# Patient Record
Sex: Female | Born: 1985 | Race: Black or African American | Hispanic: No | Marital: Single | Smoking: Never smoker
Health system: Southern US, Community
[De-identification: ages and names within clinical notes are randomized; demographics above are authoritative.]

## PROBLEM LIST (undated history)

## (undated) DIAGNOSIS — E559 Vitamin D deficiency, unspecified: Secondary | ICD-10-CM

## (undated) HISTORY — DX: Vitamin D deficiency, unspecified: E55.9

## (undated) HISTORY — PX: KNEE SURGERY: SHX244

---
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
## 2006-11-18 ENCOUNTER — Other Ambulatory Visit: Admission: RE | Admit: 2006-11-18 | Discharge: 2006-11-18 | Payer: Self-pay | Admitting: Obstetrics and Gynecology

## 2008-02-23 ENCOUNTER — Emergency Department (HOSPITAL_COMMUNITY): Admission: EM | Admit: 2008-02-23 | Discharge: 2008-02-23 | Payer: Self-pay | Admitting: Family Medicine

## 2008-02-23 DIAGNOSIS — Z8619 Personal history of other infectious and parasitic diseases: Secondary | ICD-10-CM

## 2008-02-26 ENCOUNTER — Emergency Department (HOSPITAL_COMMUNITY): Admission: EM | Admit: 2008-02-26 | Discharge: 2008-02-26 | Payer: Self-pay | Admitting: Emergency Medicine

## 2008-03-02 ENCOUNTER — Emergency Department (HOSPITAL_COMMUNITY): Admission: EM | Admit: 2008-03-02 | Discharge: 2008-03-02 | Payer: Self-pay | Admitting: Emergency Medicine

## 2008-06-24 ENCOUNTER — Ambulatory Visit: Payer: Self-pay | Admitting: Internal Medicine

## 2008-06-24 DIAGNOSIS — R635 Abnormal weight gain: Secondary | ICD-10-CM

## 2008-06-24 DIAGNOSIS — M542 Cervicalgia: Secondary | ICD-10-CM

## 2008-12-07 ENCOUNTER — Telehealth (INDEPENDENT_AMBULATORY_CARE_PROVIDER_SITE_OTHER): Payer: Self-pay | Admitting: Internal Medicine

## 2008-12-09 ENCOUNTER — Ambulatory Visit: Payer: Self-pay | Admitting: Internal Medicine

## 2008-12-09 DIAGNOSIS — J301 Allergic rhinitis due to pollen: Secondary | ICD-10-CM

## 2009-10-12 ENCOUNTER — Emergency Department (HOSPITAL_COMMUNITY): Admission: EM | Admit: 2009-10-12 | Discharge: 2009-10-12 | Payer: Self-pay | Admitting: Emergency Medicine

## 2009-10-13 ENCOUNTER — Telehealth (INDEPENDENT_AMBULATORY_CARE_PROVIDER_SITE_OTHER): Payer: Self-pay | Admitting: Internal Medicine

## 2009-10-13 DIAGNOSIS — K089 Disorder of teeth and supporting structures, unspecified: Secondary | ICD-10-CM | POA: Insufficient documentation

## 2010-05-08 NOTE — Progress Notes (Signed)
Summary: pt is under a tooth pain  Phone Note Call from Patient Call back at [phone]   Summary of Call: Since this past Tuesday, the pt is under a tooth pain (probably infected) and she needs a referral to see a dentist.  Pt has the orange card. Jaidalyn Schillo MD Initial call taken by: Manon Hilding,  October 13, 2009 8:32 AM  Follow-up for Phone Call        Left message on answering machine to return call. Dutch Quint RN  October 13, 2009 9:59 AM  Additional Follow-up for Phone Call Additional follow up Details #1::        Seen in ED yesterday and started on Amoxicillin and Vicodin for dental pain--refer to dental clinic please, [facility].  Theresa--please let pt. know we are referring Additional Follow-up by: Julieanne Manson MD,  October 13, 2009 5:19 PM  New Problems: DENTAL PAIN (ICD-525.9)   Additional Follow-up for Phone Call Additional follow up Details #2::    Left message on answering machine to return call.  Dutch Quint RN  October 16, 2009 2:38 PM   New Problems: DENTAL PAIN (ICD-525.9)  Appended Document: pt is under a tooth pain Left message on work # -- memory full on home phone -- Dutch Quint RN  October 17, 2009 10:14 AM  Micah Flesher to [facility] in [place] and had fluid drained out of tooth area, feeling better.  Has impacted tooth, advised of dental referral in progress.  Referred to [facility] for questions related to assistance with payment of out-of-pocket costs.  Dutch Quint RN  October 17, 2009 1:03 PM

## 2010-05-14 ENCOUNTER — Encounter (INDEPENDENT_AMBULATORY_CARE_PROVIDER_SITE_OTHER): Payer: Self-pay | Admitting: Internal Medicine

## 2010-05-16 ENCOUNTER — Encounter (INDEPENDENT_AMBULATORY_CARE_PROVIDER_SITE_OTHER): Payer: Self-pay | Admitting: Internal Medicine

## 2010-05-24 NOTE — Assessment & Plan Note (Signed)
Summary: TB test and Flu vaccine  Nurse Visit Pt. in for flu vaccine and TB test both needed for school, [employer]. Advised pt has not been seen here in a while recommend she schedule appt. when able. Gaylyn Cheers RN  May 14, 2010 11:18 AM   Allergies: No Known Drug Allergies  Immunizations Administered:  Influenza Vaccine # 1:    Vaccine Type: Fluvax 3+    Site: left deltoid    Mfr: Sanofi Pasteur    Dose: 0.5 ml    Route: IM    Given by: Gaylyn Cheers RN    Exp. Date: 10/06/2010    Lot #: [id number]    VIS given: 10/31/09 version given May 14, 2010.  PPD Skin Test:    Vaccine Type: PPD    Site: left forearm    Mfr: JHP Phar    Dose: 0.1 ml    Route: ID    Given by: Gaylyn Cheers RN    Exp. Date: 08/2011    Lot #: [id number]  Flu Vaccine Consent Questions:    Do you have a history of severe allergic reactions to this vaccine? no    Any prior history of allergic reactions to egg and/or gelatin? no    Do you have a sensitivity to the preservative Thimersol? no    Do you have a past history of Guillan-Barre Syndrome? no    Do you currently have an acute febrile illness? no    Have you ever had a severe reaction to latex? no    Vaccine information given and explained to patient? yes    Are you currently pregnant? no  Orders Added: 1)  Flu Vaccine 67yrs + [90658] 2)  Admin 1st Vaccine [90471] 3)  TB Skin Test [86580] 4)  Admin of Any Addtl Vaccine [90472] 5)  Est. Patient Nurse visit [09003]

## 2010-05-24 NOTE — Assessment & Plan Note (Signed)
Summary: PPD reading  Nurse Visit   Allergies: No Known Drug Allergies  PPD Results    Date of reading: 05/16/2010    Results: < 5mm    Interpretation: negative  Orders Added: 1)  Est. Patient Level I [95621]

## 2010-10-30 ENCOUNTER — Inpatient Hospital Stay (INDEPENDENT_AMBULATORY_CARE_PROVIDER_SITE_OTHER)
Admission: RE | Admit: 2010-10-30 | Discharge: 2010-10-30 | Disposition: A | Discharge status: Home / Self Care | Payer: 59 | Source: Ambulatory Visit | Attending: Family Medicine | Admitting: Family Medicine

## 2010-10-30 DIAGNOSIS — J309 Allergic rhinitis, unspecified: Secondary | ICD-10-CM

## 2011-01-08 LAB — POCT RAPID STREP A: Streptococcus, Group A Screen (Direct): NEGATIVE

## 2011-01-08 LAB — POCT INFECTIOUS MONO SCREEN: Mono Screen: POSITIVE — AB

## 2013-02-05 ENCOUNTER — Emergency Department (INDEPENDENT_AMBULATORY_CARE_PROVIDER_SITE_OTHER)
Admission: EM | Admit: 2013-02-05 | Discharge: 2013-02-05 | Disposition: A | Discharge status: Home / Self Care | Payer: Self-pay | Source: Home / Self Care

## 2013-02-05 ENCOUNTER — Encounter (HOSPITAL_COMMUNITY): Payer: Self-pay | Admitting: Emergency Medicine

## 2013-02-05 DIAGNOSIS — R5381 Other malaise: Secondary | ICD-10-CM

## 2013-02-05 DIAGNOSIS — G933 Postviral fatigue syndrome: Secondary | ICD-10-CM

## 2013-02-05 MED ORDER — MAGIC MOUTHWASH W/LIDOCAINE: 10.0000 mL | Freq: Four times a day (QID) | ORAL | Status: DC | PRN

## 2013-02-05 NOTE — ED Provider Notes (Signed)
CSN: [id number]     Arrival date & time 02/05/13  1612 History   None    Chief Complaint  Patient presents with  . Sore Throat   (Consider location/radiation/quality/duration/timing/severity/associated sxs/prior Treatment) Patient is a 27 y.o. female presenting with pharyngitis. The history is provided by the patient.  Sore Throat This is a new problem. The current episode started yesterday. The problem has not changed since onset.   History reviewed. No pertinent past medical history. History reviewed. No pertinent past surgical history. History reviewed. No pertinent family history. History  Substance Use Topics  . Smoking status: Never Smoker   . Smokeless tobacco: Not on file  . Alcohol Use: Not on file   OB History   Grav Para Term Preterm Abortions TAB SAB Ect Mult Living                 Review of Systems  Constitutional: Negative.   HENT: Positive for sore throat. Negative for postnasal drip and rhinorrhea.   Cardiovascular: Negative.   Gastrointestinal: Negative.   Hematological: Positive for adenopathy.    Allergies  Shellfish allergy  Home Medications   Current Outpatient Rx  Name  Route  Sig  Dispense  Refill  . Alum & Mag Hydroxide-Simeth (MAGIC MOUTHWASH W/LIDOCAINE) SOLN   Oral   Take 10 mLs by mouth 4 (four) times daily as needed. Gargle and spit   200 mL   1    BP 133/72  Pulse 78  Temp(Src) 98 F (36.7 C) (Oral)  Resp 16  SpO2 100%  LMP 01/06/2013 Physical Exam  Nursing note and vitals reviewed. Constitutional: She is oriented to person, place, and time. She appears well-developed and well-nourished.  HENT:  Head: Normocephalic.  Right Ear: External ear normal.  Left Ear: External ear normal.  Nose: Nose normal.  Mouth/Throat: Uvula is midline and mucous membranes are normal. Oropharyngeal exudate and posterior oropharyngeal erythema present.  Abdominal: Soft. Bowel sounds are normal.  Lymphadenopathy:    She has cervical  adenopathy.  Neurological: She is alert and oriented to person, place, and time.  Skin: Skin is warm and dry.    ED Course  Procedures (including critical care time) Labs Review Labs Reviewed  CULTURE, GROUP A STREP  POCT RAPID STREP A ([facility] ONLY)   Imaging Review No results found.    MDM  Rapid srep neg.    Linna Hoff, MD 02/05/13 [phone]

## 2013-02-05 NOTE — ED Notes (Signed)
C/o sore throat which started yesterday.  Patient feels as if it is mono since she has had that before and has the same sx.

## 2013-02-07 LAB — CULTURE, GROUP A STREP

## 2014-06-28 ENCOUNTER — Emergency Department (HOSPITAL_COMMUNITY): Payer: No Typology Code available for payment source

## 2014-06-28 ENCOUNTER — Emergency Department (INDEPENDENT_AMBULATORY_CARE_PROVIDER_SITE_OTHER)
Admission: EM | Admit: 2014-06-28 | Discharge: 2014-06-28 | Disposition: A | Discharge status: Nursing Home (Includes ICF) | Payer: PRIVATE HEALTH INSURANCE | Source: Home / Self Care | Attending: Family Medicine | Admitting: Family Medicine

## 2014-06-28 ENCOUNTER — Encounter (HOSPITAL_COMMUNITY): Payer: Self-pay | Admitting: Emergency Medicine

## 2014-06-28 ENCOUNTER — Emergency Department (HOSPITAL_COMMUNITY)
Admission: EM | Admit: 2014-06-28 | Discharge: 2014-06-28 | Disposition: A | Discharge status: Home / Self Care | Payer: No Typology Code available for payment source | Attending: Emergency Medicine | Admitting: Emergency Medicine

## 2014-06-28 ENCOUNTER — Encounter (HOSPITAL_COMMUNITY): Payer: Self-pay | Admitting: *Deleted

## 2014-06-28 DIAGNOSIS — R5081 Fever presenting with conditions classified elsewhere: Secondary | ICD-10-CM | POA: Diagnosis not present

## 2014-06-28 DIAGNOSIS — R630 Anorexia: Secondary | ICD-10-CM | POA: Insufficient documentation

## 2014-06-28 DIAGNOSIS — R6510 Systemic inflammatory response syndrome (SIRS) of non-infectious origin without acute organ dysfunction: Secondary | ICD-10-CM

## 2014-06-28 DIAGNOSIS — Z3202 Encounter for pregnancy test, result negative: Secondary | ICD-10-CM | POA: Diagnosis not present

## 2014-06-28 DIAGNOSIS — J069 Acute upper respiratory infection, unspecified: Secondary | ICD-10-CM

## 2014-06-28 DIAGNOSIS — R509 Fever, unspecified: Secondary | ICD-10-CM

## 2014-06-28 DIAGNOSIS — A419 Sepsis, unspecified organism: Secondary | ICD-10-CM | POA: Diagnosis not present

## 2014-06-28 DIAGNOSIS — M2550 Pain in unspecified joint: Secondary | ICD-10-CM | POA: Insufficient documentation

## 2014-06-28 LAB — CBC WITH DIFFERENTIAL/PLATELET
BASOS ABS: 0 10*3/uL (ref 0.0–0.1)
BASOS PCT: 0 % (ref 0–1)
EOS PCT: 0 % (ref 0–5)
Eosinophils Absolute: 0 10*3/uL (ref 0.0–0.7)
HCT: 38.4 % (ref 36.0–46.0)
HEMOGLOBIN: 12.2 g/dL (ref 12.0–15.0)
LYMPHS ABS: 0.6 10*3/uL — AB (ref 0.7–4.0)
Lymphocytes Relative: 8 % — ABNORMAL LOW (ref 12–46)
MCH: 25.5 pg — AB (ref 26.0–34.0)
MCHC: 31.8 g/dL (ref 30.0–36.0)
MCV: 80.3 fL (ref 78.0–100.0)
Monocytes Absolute: 0.8 10*3/uL (ref 0.1–1.0)
Monocytes Relative: 11 % (ref 3–12)
Neutro Abs: 6.2 10*3/uL (ref 1.7–7.7)
Neutrophils Relative %: 81 % — ABNORMAL HIGH (ref 43–77)
PLATELETS: 173 10*3/uL (ref 150–400)
RBC: 4.78 MIL/uL (ref 3.87–5.11)
RDW: 14.1 % (ref 11.5–15.5)
WBC: 7.6 10*3/uL (ref 4.0–10.5)

## 2014-06-28 LAB — I-STAT BETA HCG BLOOD, ED (MC, WL, AP ONLY): I-stat hCG, quantitative: <5 m[IU]/mL (ref <5)

## 2014-06-28 LAB — BASIC METABOLIC PANEL
Anion gap: 10 (ref 5–15)
BUN: 6 mg/dL (ref 6–23)
CALCIUM: 8.7 mg/dL (ref 8.4–10.5)
CHLORIDE: 100 mmol/L (ref 96–112)
CO2: 22 mmol/L (ref 19–32)
CREATININE: 0.99 mg/dL (ref 0.50–1.10)
GFR calc Af Amer: 90 mL/min — ABNORMAL LOW (ref >90)
GFR calc non Af Amer: 77 mL/min — ABNORMAL LOW (ref >90)
Glucose, Bld: 102 mg/dL — ABNORMAL HIGH (ref 70–99)
POTASSIUM: 3.5 mmol/L (ref 3.5–5.1)
Sodium: 132 mmol/L — ABNORMAL LOW (ref 135–145)

## 2014-06-28 LAB — I-STAT CG4 LACTIC ACID, ED: LACTIC ACID, VENOUS: 0.99 mmol/L (ref 0.5–2.0)

## 2014-06-28 LAB — POCT RAPID STREP A: Streptococcus, Group A Screen (Direct): NEGATIVE

## 2014-06-28 MED ORDER — SODIUM CHLORIDE 0.9 % IV BOLUS (SEPSIS)
1000.0000 mL | Freq: Once | INTRAVENOUS | Status: AC
  Administered 2014-06-28: 1000 mL via INTRAVENOUS

## 2014-06-28 MED ORDER — ACETAMINOPHEN 325 MG PO TABS
650.0000 mg | ORAL_TABLET | Freq: Once | ORAL | Status: AC
  Administered 2014-06-28: 650 mg via ORAL
  Filled 2014-06-28: qty 2

## 2014-06-28 MED ORDER — IBUPROFEN 800 MG PO TABS
800.0000 mg | ORAL_TABLET | Freq: Once | ORAL | Status: AC
  Administered 2014-06-28: 800 mg via ORAL

## 2014-06-28 MED ORDER — IBUPROFEN 800 MG PO TABS
ORAL_TABLET | ORAL | Status: AC
  Filled 2014-06-28: qty 1

## 2014-06-28 NOTE — ED Notes (Signed)
Patient c/o flu like sx including bodyaches, chills, fever, feeling light headed and dizzy and cold sx x 4 days. Patient reports she has tried to take OTC medications with no relief. Patient is in NAD.

## 2014-06-28 NOTE — ED Provider Notes (Signed)
CSN: [id number]     Arrival date & time 06/28/14  1451 History   First MD Initiated Contact with Patient 06/28/14 1607     Chief Complaint  Patient presents with  . Influenza   (Consider location/radiation/quality/duration/timing/severity/associated sxs/prior Treatment) HPI       29 year old female presents complaining of being sick for 4 days. This started with cold symptoms of congestion, rhinorrhea, and a cough. She has gradually gotten sicker up until today. Last night she developed a fever for the first time as well as body aches. Today she is getting very dizzy as well. Her cough has gotten somewhat better. She has nausea without vomiting. She says she has a severe headache also, denies neck stiffness. No rash. No Recent travel or sick contacts.  History reviewed. No pertinent past medical history. History reviewed. No pertinent past surgical history. No family history on file. History  Substance Use Topics  . Smoking status: Never Smoker   . Smokeless tobacco: Not on file  . Alcohol Use: Not on file   OB History    No data available     Review of Systems  Constitutional: Positive for fever and chills.  HENT: Positive for congestion, rhinorrhea and sore throat.   Respiratory: Positive for cough and shortness of breath.   Cardiovascular: Negative for chest pain.  Gastrointestinal: Positive for nausea. Negative for abdominal pain.  Musculoskeletal: Positive for myalgias and arthralgias.  Neurological: Positive for headaches.  All other systems reviewed and are negative.   Allergies  Shellfish allergy  Home Medications   Prior to Admission medications   Medication Sig Start Date End Date Taking? Authorizing Provider  Alum & Mag Hydroxide-Simeth (MAGIC MOUTHWASH W/LIDOCAINE) SOLN Take 10 mLs by mouth 4 (four) times daily as needed. Gargle and spit 02/05/13   Linna HoffJames D Kindl, MD   BP 115/73 mmHg  Pulse 125  Temp(Src) 103.1 F (39.5 C) (Oral)  Resp 16  SpO2 100%  LMP  06/22/2014 Physical Exam  Constitutional: She is oriented to person, place, and time. Vital signs are normal. She appears well-developed and well-nourished. No distress.  HENT:  Head: Normocephalic and atraumatic.  Right Ear: External ear normal.  Left Ear: External ear normal.  Nose: Nose normal.  Mouth/Throat: Posterior oropharyngeal erythema present. No oropharyngeal exudate.  Mild symmetric tonsillar enlargement and erythema  Eyes: Conjunctivae are normal.  Neck: Normal range of motion and full passive range of motion without pain. Neck supple. No Brudzinski's sign and no Kernig's sign noted.  Cardiovascular: Regular rhythm and normal heart sounds.  Tachycardia present.   Pulmonary/Chest: Effort normal and breath sounds normal. No respiratory distress.  Lymphadenopathy:    She has no cervical adenopathy.  Neurological: She is alert and oriented to person, place, and time. She has normal strength. Coordination normal.  Skin: Skin is warm and dry. No rash noted. She is not diaphoretic.  Psychiatric: She has a normal mood and affect. Judgment normal.  Nursing note and vitals reviewed.   ED Course  Procedures (including critical care time) Labs Review Labs Reviewed  POCT RAPID STREP A ([facility] ONLY)    Imaging Review No results found.   MDM   1. SIRS (systemic inflammatory response syndrome)    She is febrile and tachycardic, and dizzy. Her blood pressure is normal. With gradual onset of symptoms, not convincing for a flu like illness. Concern for SIRS/sepsis. IV started, transferred to the emergency department via [facility]    Graylon GoodZachary H Emmalyn Hinson, PA-C 06/28/14  1722 

## 2014-06-28 NOTE — ED Notes (Signed)
Pt was given 800 mg ibuprofen at 1655

## 2014-06-28 NOTE — ED Provider Notes (Signed)
CSN: [id number]     Arrival date & time 06/28/14  1755 History   First MD Initiated Contact with Patient 06/28/14 1801     Chief Complaint  Patient presents with  . Fever  . Cough     (Consider location/radiation/quality/duration/timing/severity/associated sxs/prior Treatment) HPI Comments: Plan 29-year-old female with seasonal allergies, no significant medical history, no recent travel, no infectious disease history presents with cough fever chills body aches since Saturday. No sick contacts. Patient tolerating oral well. Patient sent over from urgent care for possible sepsis workup. Patient said congestion as well and cough. Symptoms intermittent.  Patient is a 29 y.o. female presenting with fever and cough. The history is provided by the patient.  Fever Associated symptoms: congestion and cough   Associated symptoms: no chest pain, no chills, no dysuria, no headaches, no rash and no vomiting   Cough Associated symptoms: fever   Associated symptoms: no chest pain, no chills, no headaches, no rash and no shortness of breath     History reviewed. No pertinent past medical history. History reviewed. No pertinent past surgical history. History reviewed. No pertinent family history. History  Substance Use Topics  . Smoking status: Never Smoker   . Smokeless tobacco: Not on file  . Alcohol Use: Not on file   OB History    No data available     Review of Systems  Constitutional: Positive for fever and appetite change. Negative for chills.  HENT: Positive for congestion.   Eyes: Negative for visual disturbance.  Respiratory: Positive for cough. Negative for shortness of breath.   Cardiovascular: Negative for chest pain.  Gastrointestinal: Negative for vomiting and abdominal pain.  Genitourinary: Negative for dysuria and flank pain.  Musculoskeletal: Positive for arthralgias. Negative for back pain, neck pain and neck stiffness.  Skin: Negative for rash.  Neurological: Negative  for light-headedness and headaches.      Allergies  Shellfish allergy  Home Medications   Prior to Admission medications   Not on File   BP 120/75 mmHg  Pulse 92  Temp(Src) 100.6 F (38.1 C) (Oral)  Resp 16  SpO2 100%  LMP 06/22/2014 Physical Exam  Constitutional: She is oriented to person, place, and time. She appears well-developed and well-nourished.  HENT:  Head: Normocephalic and atraumatic.  Dry mucous membranes no meningismus well-appearing  Eyes: Conjunctivae are normal. Right eye exhibits no discharge. Left eye exhibits no discharge.  Neck: Normal range of motion. Neck supple. No tracheal deviation present.  Cardiovascular: Normal rate and regular rhythm.   Pulmonary/Chest: Effort normal and breath sounds normal.  Abdominal: Soft. She exhibits no distension. There is no tenderness. There is no guarding.  Musculoskeletal: She exhibits no edema or tenderness.  Neurological: She is alert and oriented to person, place, and time.  Skin: Skin is warm. No rash noted.  Psychiatric: She has a normal mood and affect.  Nursing note and vitals reviewed.   ED Course  Procedures (including critical care time) Labs Review Labs Reviewed  CBC WITH DIFFERENTIAL/PLATELET - Abnormal; Notable for the following:    MCH 25.5 (*)    Neutrophils Relative % 81 (*)    Lymphocytes Relative 8 (*)    Lymphs Abs 0.6 (*)    All other components within normal limits  BASIC METABOLIC PANEL  I-STAT CG4 LACTIC ACID, ED  I-STAT BETA HCG BLOOD, ED ([facility], [facility], [facility] ONLY)    Imaging Review Dg Chest 2 View  06/28/2014   CLINICAL DATA:  Acute onset of cough,  congestion and shortness of breath. Initial encounter.  EXAM: CHEST  2 VIEW  COMPARISON:  None.  FINDINGS: The lungs are well-aerated and clear. There is no evidence of focal opacification, pleural effusion or pneumothorax.  The heart is normal in size; the mediastinal contour is within normal limits. No acute osseous abnormalities are seen.   IMPRESSION: No acute cardiopulmonary process seen.   Electronically Signed   By: Roanna Raider M.D.   On: 06/28/2014 19:21     EKG Interpretation None      MDM   Final diagnoses:  URI (upper respiratory infection)  Other specified fever   Well-appearing healthy female with viral-like symptoms, patient sent over sepsis workup, lactate and basic blood work ordered, chest x-ray ordered to look for focal pneumonia. IV fluids and Tylenol given. Discussed reasons to return close follow up outpatient. Patient has no red flags on history of present illness.  Patient clinically does not appear septic, well-appearing in ER. Lactate normal chest x-ray reviewed no acute infiltrate. Results and differential diagnosis were discussed with the patient/parent/guardian. Close follow up outpatient was discussed, comfortable with the plan.   Medications  sodium chloride 0.9 % bolus 1,000 mL (1,000 mLs Intravenous New Bag/Given 06/28/14 1922)  acetaminophen (TYLENOL) tablet 650 mg (650 mg Oral Given 06/28/14 1922)    Filed Vitals:   06/28/14 1759  BP: 120/75  Pulse: 92  Temp: 100.6 F (38.1 C)  TempSrc: Oral  Resp: 16  SpO2: 100%    Final diagnoses:  URI (upper respiratory infection)  Other specified fever       Blane Ohara, MD 06/28/14 1930

## 2014-06-28 NOTE — ED Notes (Signed)
Per EMS: pt coming from UC with c/o fever, chills, body aches for 4 days. [facility]

## 2014-06-28 NOTE — Discharge Instructions (Signed)
If you were given medicines take as directed.  If you are on coumadin or contraceptives realize their levels and effectiveness is altered by many different medicines.  If you have any reaction (rash, tongues swelling, other) to the medicines stop taking and see a physician.   Please follow up as directed and return to the ER or see a physician for new or worsening symptoms.  Thank you. Filed Vitals:   06/28/14 1759  BP: 120/75  Pulse: 92  Temp: 100.6 F (38.1 C)  TempSrc: Oral  Resp: 16  SpO2: 100%

## 2014-07-02 LAB — CULTURE, GROUP A STREP: Strep A Culture: NEGATIVE

## 2014-10-21 ENCOUNTER — Emergency Department (HOSPITAL_COMMUNITY)
Admission: EM | Admit: 2014-10-21 | Discharge: 2014-10-21 | Disposition: A | Discharge status: Home / Self Care | Payer: No Typology Code available for payment source | Attending: Emergency Medicine | Admitting: Emergency Medicine

## 2014-10-21 ENCOUNTER — Encounter (HOSPITAL_COMMUNITY): Payer: Self-pay | Admitting: Emergency Medicine

## 2014-10-21 DIAGNOSIS — S80861A Insect bite (nonvenomous), right lower leg, initial encounter: Secondary | ICD-10-CM | POA: Diagnosis present

## 2014-10-21 DIAGNOSIS — L259 Unspecified contact dermatitis, unspecified cause: Secondary | ICD-10-CM | POA: Diagnosis not present

## 2014-10-21 DIAGNOSIS — Y92007 Garden or yard of unspecified non-institutional (private) residence as the place of occurrence of the external cause: Secondary | ICD-10-CM | POA: Diagnosis not present

## 2014-10-21 DIAGNOSIS — Y998 Other external cause status: Secondary | ICD-10-CM | POA: Insufficient documentation

## 2014-10-21 DIAGNOSIS — Y9389 Activity, other specified: Secondary | ICD-10-CM | POA: Insufficient documentation

## 2014-10-21 DIAGNOSIS — W57XXXA Bitten or stung by nonvenomous insect and other nonvenomous arthropods, initial encounter: Secondary | ICD-10-CM | POA: Diagnosis not present

## 2014-10-21 MED ORDER — CEPHALEXIN 500 MG PO CAPS
500.0000 mg | ORAL_CAPSULE | Freq: Four times a day (QID) | ORAL | Status: DC
Start: 2014-10-21 — End: 2015-01-25

## 2014-10-21 MED ORDER — PREDNISONE 10 MG PO TABS: 40.0000 mg | ORAL_TABLET | Freq: Every day | ORAL | Status: DC

## 2014-10-21 NOTE — ED Provider Notes (Signed)
CSN: [id number]     Arrival date & time 10/21/14  0003 History   First MD Initiated Contact with Patient 10/21/14 0021     Chief Complaint  Patient presents with  . Insect Bite     (Consider location/radiation/quality/duration/timing/severity/associated sxs/prior Treatment) The history is provided by the patient and medical records. No language interpreter was used.    Dorethy L Earlene PlaterDavis is a 29 y.o. female  with no major medical problems presents to the Emergency Department complaining of gradual, persistent, progressively worsening rash to the right posterior leg onset 2 days ago. Associated symptoms include itching, redness and "puss."  Pt has used hydrocortisone cream without total relief.  Nothing makes it better and nothing makes it worse.  Pt denies fever, chills, nausea, vomiting.     History reviewed. No pertinent past medical history. History reviewed. No pertinent past surgical history. No family history on file. History  Substance Use Topics  . Smoking status: Never Smoker   . Smokeless tobacco: Not on file  . Alcohol Use: Yes   OB History    No data available     Review of Systems  Constitutional: Negative for fever, diaphoresis, appetite change, fatigue and unexpected weight change.  HENT: Negative for mouth sores.   Eyes: Negative for visual disturbance.  Respiratory: Negative for cough, chest tightness, shortness of breath and wheezing.   Cardiovascular: Negative for chest pain.  Gastrointestinal: Negative for nausea, vomiting, abdominal pain, diarrhea and constipation.  Endocrine: Negative for polydipsia, polyphagia and polyuria.  Genitourinary: Negative for dysuria, urgency, frequency and hematuria.  Musculoskeletal: Negative for back pain and neck stiffness.  Skin: Positive for rash.  Allergic/Immunologic: Negative for immunocompromised state.  Neurological: Negative for syncope, light-headedness and headaches.  Hematological: Does not bruise/bleed easily.   Psychiatric/Behavioral: Negative for sleep disturbance. The patient is not nervous/anxious.       Allergies  Shellfish allergy  Home Medications   Prior to Admission medications   Medication Sig Start Date End Date Taking? Authorizing Provider  cephALEXin (KEFLEX) 500 MG capsule Take 1 capsule (500 mg total) by mouth 4 (four) times daily. 10/21/14   Marbella Markgraf, PA-C  predniSONE (DELTASONE) 10 MG tablet Take 4 tablets (40 mg total) by mouth daily. 10/21/14   Lazariah Savard, PA-C   BP 136/79 mmHg  Pulse 69  Temp(Src) 98.2 F (36.8 C) (Oral)  Resp 20  SpO2 100%  LMP 10/13/2014 Physical Exam  Constitutional: She is oriented to person, place, and time. She appears well-developed and well-nourished. No distress.  HENT:  Head: Normocephalic and atraumatic.  Right Ear: Tympanic membrane, external ear and ear canal normal.  Left Ear: Tympanic membrane, external ear and ear canal normal.  Nose: Nose normal. No mucosal edema or rhinorrhea.  Mouth/Throat: Uvula is midline. No uvula swelling. No oropharyngeal exudate, posterior oropharyngeal edema, posterior oropharyngeal erythema or tonsillar abscesses.  No swelling of the uvula or oropharynx   Eyes: Conjunctivae are normal.  Neck: Normal range of motion.  Patent airway No stridor; normal phonation Handling secretions without difficulty  Cardiovascular: Normal rate, normal heart sounds and intact distal pulses.   No murmur heard. Pulmonary/Chest: Effort normal and breath sounds normal. No stridor. No respiratory distress. She has no wheezes.  No wheezes or rhonchi  Abdominal: Soft. Bowel sounds are normal. There is no tenderness.  Musculoskeletal: Normal range of motion. She exhibits no edema.  Neurological: She is alert and oriented to person, place, and time.  Skin: Skin is warm and dry. Rash  noted. She is not diaphoretic.  Multiple lesions to the posterior right calf and leg with erythema, mild induration and  vesicles Mild excoriations - no induration or fluctuance to indicate secondary infection No purulent drainage.    Psychiatric: She has a normal mood and affect.  Nursing note and vitals reviewed.   ED Course  Procedures (including critical care time) Labs Review Labs Reviewed - No data to display  Imaging Review No results found.   EKG Interpretation None      MDM   Final diagnoses:  Contact dermatitis   Maelys L Tuckett presents with rash to the right leg.  Rash consistent with poison ivy, poison oak. Patient denies any difficulty breathing or swallowing.  Pt has a patent airway without stridor and is handling secretions without difficulty; no angioedema. Several reactive vesicles noted to the sites.  No blisters, no pustules, no warmth, no draining sinus tracts, no superficial abscesses, no bullous impetigo, no desquamation, no target lesions with dusky purpura or a central bulla. Not tender to touch. No concern for superimposed infection. No concern for SJS, TEN, TSS, tick borne illness, syphilis or other life-threatening condition. Will discharge home with short course of steroids, hydrocortisone; recommend Benadryl as needed for pruritis.    Dahlia Client Jakell Trusty, PA-C 10/21/14 1610  Gwyneth Sprout, MD 10/21/14 2221

## 2014-10-21 NOTE — ED Notes (Signed)
Pt states she was working in her yard 10/20/14 and now c/o severe itching to right calf.  Multiple welts noted with exudate covered with hydrocortrisone cream by pt. States pain 3/10

## 2014-10-21 NOTE — ED Notes (Signed)
Pt. presents with multiple itchy insect bites at right calf onset last night with redness / swelling .

## 2014-10-21 NOTE — Discharge Instructions (Signed)
1. Medications: Prednisone, Benadryl for itching, OTC Zanfel and hydrocortisone lotion, usual home medications 2. Treatment: rest, drink plenty of fluids, take medications as prescribed 3. Follow Up: Please followup with your primary doctor in 3 days for discussion of your diagnoses and further evaluation after today's visit; if you do not have a primary care doctor use the resource guide provided to find one; followup with dermatology as needed; Return to the ER for difficulty breathing, return of allergic reaction or other concerning symptoms   Contact Dermatitis Contact dermatitis is a reaction to certain substances that touch the skin. Contact dermatitis can be either irritant contact dermatitis or allergic contact dermatitis. Irritant contact dermatitis does not require previous exposure to the substance for a reaction to occur.Allergic contact dermatitis only occurs if you have been exposed to the substance before. Upon a repeat exposure, your body reacts to the substance.  CAUSES  Many substances can cause contact dermatitis. Irritant dermatitis is most commonly caused by repeated exposure to mildly irritating substances, such as:  Makeup.  Soaps.  Detergents.  Bleaches.  Acids.  Metal salts, such as nickel. Allergic contact dermatitis is most commonly caused by exposure to:  Poisonous plants.  Chemicals (deodorants, shampoos).  Jewelry.  Latex.  Neomycin in triple antibiotic cream.  Preservatives in products, including clothing. SYMPTOMS  The area of skin that is exposed may develop:  Dryness or flaking.  Redness.  Cracks.  Itching.  Pain or a burning sensation.  Blisters. With allergic contact dermatitis, there may also be swelling in areas such as the eyelids, mouth, or genitals.  DIAGNOSIS  Your caregiver can usually tell what the problem is by doing a physical exam. In cases where the cause is uncertain and an allergic contact dermatitis is suspected, a  patch skin test may be performed to help determine the cause of your dermatitis. TREATMENT Treatment includes protecting the skin from further contact with the irritating substance by avoiding that substance if possible. Barrier creams, powders, and gloves may be helpful. Your caregiver may also recommend:  Steroid creams or ointments applied 2 times daily. For best results, soak the rash area in cool water for 20 minutes. Then apply the medicine. Cover the area with a plastic wrap. You can store the steroid cream in the refrigerator for a "chilly" effect on your rash. That may decrease itching. Oral steroid medicines may be needed in more severe cases.  Antibiotics or antibacterial ointments if a skin infection is present.  Antihistamine lotion or an antihistamine taken by mouth to ease itching.  Lubricants to keep moisture in your skin.  Burow's solution to reduce redness and soreness or to dry a weeping rash. Mix one packet or tablet of solution in 2 cups cool water. Dip a clean washcloth in the mixture, wring it out a bit, and put it on the affected area. Leave the cloth in place for 30 minutes. Do this as often as possible throughout the day.  Taking several cornstarch or baking soda baths daily if the area is too large to cover with a washcloth. Harsh chemicals, such as alkalis or acids, can cause skin damage that is like a burn. You should flush your skin for 15 to 20 minutes with cold water after such an exposure. You should also seek immediate medical care after exposure. Bandages (dressings), antibiotics, and pain medicine may be needed for severely irritated skin.  HOME CARE INSTRUCTIONS  Avoid the substance that caused your reaction.  Keep the area of skin  that is affected away from hot water, soap, sunlight, chemicals, acidic substances, or anything else that would irritate your skin.  Do not scratch the rash. Scratching may cause the rash to become infected.  You may take cool  baths to help stop the itching.  Only take over-the-counter or prescription medicines as directed by your caregiver.  See your caregiver for follow-up care as directed to make sure your skin is healing properly. SEEK MEDICAL CARE IF:   Your condition is not better after 3 days of treatment.  You seem to be getting worse.  You see signs of infection such as swelling, tenderness, redness, soreness, or warmth in the affected area.  You have any problems related to your medicines. Document Released: 03/22/2000 Document Revised: 06/17/2011 Document Reviewed: 08/28/2010 [facility] Patient Information 2015 [place], [place]. This information is not intended to replace advice given to you by your health care provider. Make sure you discuss any questions you have with your health care provider.    Emergency Department Resource Guide 1) Find a Doctor and Pay Out of Pocket Although you won't have to find out who is covered by your insurance plan, it is a good idea to ask around and get recommendations. You will then need to call the office and see if the doctor you have chosen will accept you as a new patient and what types of options they offer for patients who are self-pay. Some doctors offer discounts or will set up payment plans for their patients who do not have insurance, but you will need to ask so you aren't surprised when you get to your appointment.  2) Contact Your Local Health Department Not all health departments have doctors that can see patients for sick visits, but many do, so it is worth a call to see if yours does. If you don't know where your local health department is, you can check in your phone book. The CDC also has a tool to help you locate your state's health department, and many state websites also have listings of all of their local health departments.  3) Find a Walk-in Clinic If your illness is not likely to be very severe or complicated, you may want to try a walk in clinic.  These are popping up all over the country in pharmacies, drugstores, and shopping centers. They're usually staffed by nurse practitioners or physician assistants that have been trained to treat common illnesses and complaints. They're usually fairly quick and inexpensive. However, if you have serious medical issues or chronic medical problems, these are probably not your best option.  No Primary Care Doctor: - Call Health Connect at  [phone] - they can help you locate a primary care doctor that  accepts your insurance, provides certain services, etc. - Physician Referral Service- [phone]  Chronic Pain Problems: Organization         Address  Phone   Notes  [facility]  [phone] Patients need to be referred by their primary care doctor.   Medication Assistance: Organization         Address  Phone   Notes  [facility] Medication [facility] [address] [place] [place], [place] [address] [phone] --Must be a resident of [facility] -- Must have NO insurance coverage whatsoever (no Medicaid/ Medicare, etc.) -- The pt. MUST have a primary care doctor that directs their care regularly and follows them in the community   [facility]  [phone]   [place]  [address]  [phone]    Agencies that provide inexpensive medical care: Organization         Address  Phone   Notes  [facility]  [phone]   [facility]    [phone]   [facility] [address] [place], [place] [address] [phone]   [facility] [address] [place] [place] [phone]   [facility]anned Parenthood    [phone]   [facility]    [phone]   [facility]  [address], [place] Phone:  [phone], Fax:  [phone] Hours of Operation:  9 am - 6 pm, M-F.  Also accepts Medicaid/Medicare and self-pay.  [facility]  [address], [place] Phone: [phone], Fax: [phone]. Hours of Operation:  8:30 am - 5:30 pm, M-F.  Also accepts Medicaid and self-pay.  [facility] [place] [address] [place] Phone: [phone]   [facility] [address] Natasha Bence [place], [place] [phone], Ext. [phone] Mondays & Thursdays: 7-9 AM.  First 15 patients are seen on a first come, first serve basis.    Medicaid-accepting [facility] Providers:  Organization         Address  Phone   Notes  [facility] [address], [place] [phone] Also accepts self-pay patients.  [facility] [address] Laurell Josephs [place], [place]  [phone]   [facility] [address], [place] [phone]   [facility] [address] [place] [phone]   Renaye Rakers [address], [place]   [phone] Only accepts [place] Access [place] patients after they have their name applied to their card.   Self-Pay (no insurance) in [facility]:  Organization         Address  Phone   Notes  Sickle Cell Patients, [facility] [address] [place], [place] [phone]   [facility] [address] [place], [place] [phone]   [facility] [place]  [address], [place] [phone]   [facility]/Dr. Osei-Bonsu  [address] [place] or [address], [place] [phone] Phone number for both [place] and [place] locations is the same.  [facility] [address] [place] [phone]   [facility] [address], [place] or [address] [phone]   [facility] [address], [place] [phone], phone; [phone], fax Sees patients 1st  and 3rd Saturday of every month.  Must not qualify for public or private insurance (i.e. Medicaid, Medicare, [place] Health Choice, Veterans' Benefits)  Household income should be no more than 200% of the poverty level The clinic cannot treat you if you are pregnant or think you are pregnant  Sexually transmitted diseases are not treated at the clinic.    Dental Care: Organization         Address  Phone  Notes  [facility] [address] [place], [place] [phone] Accepts children up to age 73 who are enrolled in [place] or [place] Health Choice; pregnant women with a Medicaid card; and  children who have applied for Medicaid or [place] Health Choice, but were declined, whose parents can pay a reduced fee at time of service.  [facility]  [address], [place] [phone] Accepts children up to age 59 who are enrolled in [place] or [place] Health Choice; pregnant women with a Medicaid card; and children who have applied for Medicaid or [place] Health Choice, but were declined, whose parents can pay a reduced fee at time of service.  [facility]  [address] [place], [place] [phone] Patients are seen by appointment only. Walk-ins are not accepted. [facility] will see patients 27 years of age and older. Monday - Tuesday (8am-5pm) Most Wednesdays (8:30-5pm) $30 per visit, cash only  [facility] Adult Dental Access PROGRAM  [address], [facility] [phone] Patients are seen by appointment only. Walk-ins are not accepted. [facility] will see patients 37 years of age and older. One Wednesday Evening (Monthly: Volunteer Based).  $30 per visit, cash only  [employer]  [phone] for adults; Children under age 48, call Graduate Pediatric Dentistry at [phone]. Children aged 12-14, please call [phone] to request a pediatric  application.  Dental services are provided in all areas of dental care including fillings, crowns and bridges, complete and partial dentures, implants, gum treatment, root canals, and extractions. Preventive care is also provided. Treatment is provided to both adults and children. Patients are selected via a lottery and there is often a waiting list.   [facility] [address] [place]  [phone] [email or web address]   [facility] [address] [place], [place] [phone], Ext. [phone] Second and Fourth Thursday of each month, opens at 6:30 AM; Clinic ends at 9 AM.  Patients are seen on a first-come first-served basis, and a limited number are seen during each clinic.   [facility]  [address] Ether Griffins [place], [place] [phone]   Eligibility Requirements You must have lived in [place], [place], or [place] counties for at least the last three months.   You cannot be eligible for state or federal sponsored [place], including [employer], [place], or [employer].   You generally cannot be eligible for healthcare insurance through your employer.    How to apply: Eligibility screenings are held every Tuesday and Wednesday afternoon from 1:00 pm until 4:00 pm. You do not need an appointment for the interview!  [facility] [address], [place], [place] [phone]   [facility] Health Department  [phone]   [facility] Health Department  [phone]   [facility] Health Department  [phone]    Behavioral Health Resources in the Community: Intensive Outpatient Programs Organization         Address  Phone  Notes  [facility] Services [address] [place], [place] [phone]   [facility] Outpatient [address] [place], [place] [phone]   [facility] [address] [place], [place]  [phone]   [facility] Mental Health  [address],  [place], [place] [phone] or [phone]   Substance Abuse Resources Organization         Address  Phone  Notes  [facility] Drug Services  [phone]   [facility]  [phone]   [facility]  [phone]   [facility]  [phone]   Residential &  Outpatient Substance Abuse Program  [phone]   Psychological Services Organization         Address  Phone  Notes  [facility]  [phone]   [facility]  [phone]   [facility] Mental Health [phone] [address], [place] [phone] or [phone]    Mobile Crisis Teams Organization         Address  Phone  Notes  [facility], Mobile Crisis Care Unit  [phone]   [facility]  [address] [place], [place] [phone]   Doristine Locks [address] [place] [phone]    Self-Help/Support Groups Organization         Address  Phone             Notes  [facility] [place] - variety of support groups  336- I7437963 Call for more information  Narcotics Anonymous (NA), [facility] [address], [employer] [place]  2 meetings at this location   Statistician         Address  Phone  Notes  [facility] [address],    [place]  [phone]   [facility]  [address] [place] [address], [place], [place] [phone]   [facility] [address] [place], [place] [phone] Admissions: 8am-3pm M-F  [facility] [address]    [place], [place] [phone]   [facility] [address] [place], [place], [place] [phone]   [facility] [address],  [place], [place] [phone]   [facility] - Intensive Outpatient [address] Laurell Josephs [address], [place], [place] [phone]   [facility] (Addiction Recovery [facility] [address] [place]    [place], [place] [phone] or [phone]   [facility] ([facility]) [address], [place], [place] [phone] Accepts Medicaid  [facility] [address]  [place] [phone] Substance Abuse/Addiction Treatment   [facility] Organization         Address  Phone  Notes  [facility]  3045535998   Angie Fava, PhD [address] Ervin Knack [place], [place]   [phone] or [phone]   [facility]   [address] [place], [place] [phone]   [facility] [address], [place], [place] [phone] Insurance/Medicaid/sponsorship through [facility] [address]                                    [place], [place] [phone] Therapy/tele-psych/case  [facility] [address], [place] [phone]    Dr. Lolly Mustache  [phone]   [facility]  [employer] [facility] Dept. 1) [address], [place] 2) [address] [place] 3)  [address], [place] [phone]  [phone]   [facility] Child Abuse Hotline (864)391-6649 or [phone] (After Hours)

## 2015-01-05 ENCOUNTER — Ambulatory Visit (INDEPENDENT_AMBULATORY_CARE_PROVIDER_SITE_OTHER): Payer: No Typology Code available for payment source | Admitting: Gynecology

## 2015-01-05 ENCOUNTER — Encounter: Payer: Self-pay | Admitting: Gynecology

## 2015-01-05 ENCOUNTER — Other Ambulatory Visit (HOSPITAL_COMMUNITY)
Admission: RE | Admit: 2015-01-05 | Discharge: 2015-01-05 | Disposition: A | Discharge status: Home / Self Care | Payer: No Typology Code available for payment source | Source: Ambulatory Visit | Attending: Gynecology | Admitting: Gynecology

## 2015-01-05 VITALS — BP 124/76 | Ht 61.75 in | Wt 176.0 lb

## 2015-01-05 DIAGNOSIS — Z01419 Encounter for gynecological examination (general) (routine) without abnormal findings: Secondary | ICD-10-CM | POA: Insufficient documentation

## 2015-01-05 LAB — CBC WITH DIFFERENTIAL/PLATELET
Basophils Absolute: 0 10*3/uL (ref 0.0–0.1)
Basophils Relative: 0 % (ref 0–1)
EOS ABS: 0.2 10*3/uL (ref 0.0–0.7)
EOS PCT: 2 % (ref 0–5)
HCT: 40 % (ref 36.0–46.0)
Hemoglobin: 12.9 g/dL (ref 12.0–15.0)
Lymphocytes Relative: 30 % (ref 12–46)
Lymphs Abs: 2.5 10*3/uL (ref 0.7–4.0)
MCH: 25.6 pg — ABNORMAL LOW (ref 26.0–34.0)
MCHC: 32.3 g/dL (ref 30.0–36.0)
MCV: 79.4 fL (ref 78.0–100.0)
MPV: 11 fL (ref 8.6–12.4)
Monocytes Absolute: 0.7 10*3/uL (ref 0.1–1.0)
Monocytes Relative: 8 % (ref 3–12)
NEUTROS PCT: 60 % (ref 43–77)
Neutro Abs: 4.9 10*3/uL (ref 1.7–7.7)
PLATELETS: 231 10*3/uL (ref 150–400)
RBC: 5.04 MIL/uL (ref 3.87–5.11)
RDW: 15.2 % (ref 11.5–15.5)
WBC: 8.2 10*3/uL (ref 4.0–10.5)

## 2015-01-05 LAB — COMPREHENSIVE METABOLIC PANEL
ALK PHOS: 62 U/L (ref 33–115)
ALT: 9 U/L (ref 6–29)
AST: 15 U/L (ref 10–30)
Albumin: 4.4 g/dL (ref 3.6–5.1)
BUN: 10 mg/dL (ref 7–25)
CO2: 25 mmol/L (ref 20–31)
CREATININE: 0.93 mg/dL (ref 0.50–1.10)
Calcium: 9.2 mg/dL (ref 8.6–10.2)
Chloride: 105 mmol/L (ref 98–110)
GLUCOSE: 88 mg/dL (ref 65–99)
POTASSIUM: 4.9 mmol/L (ref 3.5–5.3)
Sodium: 137 mmol/L (ref 135–146)
Total Bilirubin: 0.3 mg/dL (ref 0.2–1.2)
Total Protein: 7.1 g/dL (ref 6.1–8.1)

## 2015-01-05 LAB — LIPID PANEL
CHOL/HDL RATIO: 3.1 ratio (ref <=5.0)
Cholesterol: 174 mg/dL (ref 125–200)
HDL: 57 mg/dL (ref >=46)
LDL Cholesterol: 100 mg/dL (ref <130)
Triglycerides: 86 mg/dL (ref <150)
VLDL: 17 mg/dL (ref <30)

## 2015-01-05 LAB — TSH: TSH: 4.841 u[IU]/mL — AB (ref 0.350–4.500)

## 2015-01-05 NOTE — Patient Instructions (Signed)

## 2015-01-05 NOTE — Progress Notes (Signed)
Kylie Cline 04/22/1985 [id number]   History:    29 y.o.  for annual gyn exam who is a new patient to the practice. Patient stated she had been getting her GYN exams at the health department. Patient has had a nonproductive cough for several months and stated she had a chest x-ray recently which was normal. She was asymptomatic today. Patient denies any past history of any abnormal Pap smear. Patient declined flu vaccine. Patient not sexually active. Patient reports normal menstrual cycles  Past medical history,surgical history, family history and social history were all reviewed and documented in the EPIC chart.  Gynecologic History Patient's last menstrual period was 12/14/2014. Contraception: none Last Pap: Several years ago. Results were: normal Last mammogram: Not indicated. Results were: Not indicated  Obstetric History OB History  Gravida Para Term Preterm AB SAB TAB Ectopic Multiple Living          ROS: A ROS was performed and pertinent positives and negatives are included in the history.  GENERAL: No fevers or chills. HEENT: No change in vision, no earache, sore throat or sinus congestion. NECK: No pain or stiffness. CARDIOVASCULAR: No chest pain or pressure. No palpitations. PULMONARY: No shortness of breath, cough or wheeze. GASTROINTESTINAL: No abdominal pain, nausea, vomiting or diarrhea, melena or bright red blood per rectum. GENITOURINARY: No urinary frequency, urgency, hesitancy or dysuria. MUSCULOSKELETAL: No joint or muscle pain, no back pain, no recent trauma. DERMATOLOGIC: No rash, no itching, no lesions. ENDOCRINE: No polyuria, polydipsia, no heat or cold intolerance. No recent change in weight. HEMATOLOGICAL: No anemia or easy bruising or bleeding. NEUROLOGIC: No headache, seizures, numbness, tingling or weakness. PSYCHIATRIC: No depression, no loss of interest in normal activity or change in sleep pattern.     Exam: chaperone present  BP  124/76 mmHg  Ht 5' 1.75" (1.568 m)  Wt 176 lb (79.833 kg)  BMI 32.47 kg/m2  LMP 12/14/2014  Body mass index is 32.47 kg/(m^2).  General appearance : Well developed well nourished female. No acute distress HEENT: Eyes: no retinal hemorrhage or exudates,  Neck supple, trachea midline, no carotid bruits, no thyroidmegaly Lungs: Clear to auscultation, no rhonchi or wheezes, or rib retractions  Heart: Regular rate and rhythm, no murmurs or gallops Breast:Examined in sitting and supine position were symmetrical in appearance, no palpable masses or tenderness,  no skin retraction, no nipple inversion, no nipple discharge, no skin discoloration, no axillary or supraclavicular lymphadenopathy Abdomen: no palpable masses or tenderness, no rebound or guarding Extremities: no edema or skin discoloration or tenderness  Pelvic:  Bartholin, Urethra, Skene Glands: Within normal limits             Vagina: No gross lesions or discharge  Cervix: No gross lesions or discharge  Uterus  retroverted, normal size, shape and consistency, non-tender and mobile  Adnexa  Without masses or tenderness  Anus and perineum  normal   Rectovaginal  normal sphincter tone without palpated masses or tenderness             Hemoccult not indicated     Assessment/Plan:  29 y.o. female for annual exam new patient to the practice with normal GYN exam today. Patient will be given names of several internal medicine physicians for her to be established. Patient complained of several months a nonproductive cough recent chest x-ray according to patient was normal. Pulmonary auscultation was normal today. Patient was asymptomatic today. The following  screening blood work were ordered today: Comprehensive metabolic panel, TSH, CBC, fasting lipid profile and urinalysis. Pap smear without HPV according to the new guidelines was obtained today. Instructions on monthly breast exam was provided. Patient declined flu  vaccine.   Ok Edwards MD, 12:15 PM 01/05/2015

## 2015-01-05 NOTE — Addendum Note (Signed)
Addended by: Berna Spare A on: 01/05/2015 12:47 PM   Modules accepted: Orders

## 2015-01-06 ENCOUNTER — Other Ambulatory Visit: Payer: Self-pay | Admitting: Gynecology

## 2015-01-06 DIAGNOSIS — R7989 Other specified abnormal findings of blood chemistry: Secondary | ICD-10-CM

## 2015-01-06 LAB — URINALYSIS W MICROSCOPIC + REFLEX CULTURE
Bacteria, UA: NONE SEEN [HPF]
Bilirubin Urine: NEGATIVE
CASTS: NONE SEEN [LPF]
CRYSTALS: NONE SEEN [HPF]
Glucose, UA: NEGATIVE
Hgb urine dipstick: NEGATIVE
KETONES UR: NEGATIVE
Leukocytes, UA: NEGATIVE
Nitrite: NEGATIVE
Protein, ur: NEGATIVE
RBC / HPF: NONE SEEN RBC/HPF (ref <=2)
Specific Gravity, Urine: 1.026 (ref 1.001–1.035)
WBC, UA: NONE SEEN WBC/HPF (ref <=5)
Yeast: NONE SEEN [HPF]
pH: 5.5 (ref 5.0–8.0)

## 2015-01-09 LAB — CYTOLOGY - PAP

## 2015-01-10 ENCOUNTER — Other Ambulatory Visit: Payer: No Typology Code available for payment source

## 2015-01-10 DIAGNOSIS — R7989 Other specified abnormal findings of blood chemistry: Secondary | ICD-10-CM

## 2015-01-10 LAB — THYROID PANEL WITH TSH
FREE THYROXINE INDEX: 2.4 (ref 1.4–3.8)
T3 Uptake: 30 % (ref 22–35)
T4 TOTAL: 7.9 ug/dL (ref 4.5–12.0)
TSH: 6.664 u[IU]/mL — AB (ref 0.350–4.500)

## 2015-01-11 ENCOUNTER — Telehealth: Payer: Self-pay | Admitting: *Deleted

## 2015-01-11 DIAGNOSIS — E069 Thyroiditis, unspecified: Secondary | ICD-10-CM

## 2015-01-11 NOTE — Telephone Encounter (Signed)
Referral placed Dr.Kumar office will contact to schedule.

## 2015-01-11 NOTE — Telephone Encounter (Signed)
-----   Message from Kylie Cline, [place] sent at 01/11/2015 11:49 AM EDT ----- Regarding: referral to Dr. Lucianne Muss "Please inform patient that her TSH continues to be elevated but the rest of her panel was normal sometime discontinue indicative of a subacute thyroiditis or as we called Hashimoto's thyroiditis I would like for her to see the endocrinologist Dr. Lucianne Muss for second opinion and possible management and recommendations.".  Patient informed. She will wait to hear from you with appt date/time.

## 2015-01-16 NOTE — Telephone Encounter (Signed)
Appointment 01/25/15 @ 11:15am

## 2015-01-25 ENCOUNTER — Ambulatory Visit (INDEPENDENT_AMBULATORY_CARE_PROVIDER_SITE_OTHER): Payer: PRIVATE HEALTH INSURANCE | Admitting: Endocrinology

## 2015-01-25 ENCOUNTER — Encounter: Payer: Self-pay | Admitting: Endocrinology

## 2015-01-25 VITALS — BP 122/82 | HR 74 | Temp 97.9°F | Resp 14 | Ht 61.75 in | Wt 174.8 lb

## 2015-01-25 DIAGNOSIS — E038 Other specified hypothyroidism: Secondary | ICD-10-CM | POA: Diagnosis not present

## 2015-01-25 DIAGNOSIS — E039 Hypothyroidism, unspecified: Secondary | ICD-10-CM

## 2015-01-25 NOTE — Patient Instructions (Signed)
Lab Results  Component Value Date   TSH 6.664* 01/10/2015   TSH 4.841* 01/05/2015   Normal < 4.5

## 2015-01-25 NOTE — Progress Notes (Signed)
Patient ID: Kylie Cline, female   DOB: [birth year], 29 y.o.   MRN: [id number]            Reason for Appointment:  ? Hypothyroidism, new visit    History of Present Illness:   The Hypothyroidism was first diagnosed in 12/2014  She was having screening labs done at her initial evaluation with her gynecologist and had a borderline high TSH level Patient did not complain of any unusual fatigue, cold sensitivity, difficulty concentrating, dry skin or hair loss. She has had fluctuation of her weight over the last few years based on how much she is exercising and this year has gained 10 pounds .           TSH was repeated and still abnormal, free thyroxine index normal. She is here for further evaluation   Lab Results  Component Value Date   TSH 6.664* 01/10/2015   TSH 4.841* 01/05/2015     No past medical history on file.  Past Surgical History  Procedure Laterality Date  . Knee surgery Right     Family History  Problem Relation Age of Onset  . Hypertension Father   . Diabetes Maternal Grandfather   . Diabetes Paternal Grandmother     Social History:  reports that she has never smoked. She does not have any smokeless tobacco history on file. She reports that she drinks alcohol. She reports that she does not use illicit drugs.  Allergies:  Allergies  Allergen Reactions  . Shellfish Allergy       Medication List       This list is accurate as of: 01/25/15 11:53 AM.  Always use your most recent med list.               cephALEXin 500 MG capsule  Commonly known as:  KEFLEX  Take 1 capsule (500 mg total) by mouth 4 (four) times daily.     multivitamin-iron-minerals-folic acid chewable tablet  Chew 1 tablet by mouth daily.     predniSONE 10 MG tablet  Commonly known as:  DELTASONE  Take 4 tablets (40 mg total) by mouth daily.        Review of Systems:  Review of Systems  Constitutional: Negative for malaise/fatigue.  Cardiovascular: Negative for leg  swelling.  Gastrointestinal: Negative for constipation.  Musculoskeletal: Negative for joint pain.  Neurological: Negative for tingling.  Psychiatric/Behavioral: Negative for depression.    CARDIOLOGY: no history of high blood pressure.            ENDOCRINOLOGY:  no history of Diabetes.             Examination:    BP 122/82 mmHg  Pulse 74  Temp(Src) 97.9 F (36.6 C)  Resp 14  Ht 5' 1.75" (1.568 m)  Wt 174 lb 12.8 oz (79.289 kg)  BMI 32.25 kg/m2  SpO2 99%  LMP 12/14/2014  GENERAL:  Well-built and nourished, mild generalized obesity present.  No pallor, clubbing, lymphadenopathy or edema.  Skin:  no rash   EYES:  No prominence of the eyes or swelling of the eyelids  ENT: Oral mucosa normal  THYROID:  The right lobe of the thyroid is soft, smooth and just palpable. The left lobe not palpable.  No nodule felt  HEART:  Normal  S1 and S2; no murmur or click.  CHEST:    Lungs: Vescicular breath sounds heard equally.  No crepitations/ wheeze.  ABDOMEN:  No distention.  Liver and spleen not palpable.  No other mass or tenderness.  NEUROLOGICAL: Reflexes are bilaterally normal at bicepsand brisk at ankles.  JOINTS:  Normal peripheral joints.   Assessment   Subclinical hypothyroidism  She has had a mildly increased TSH without any symptoms currently. Has minimal soft swelling of her right thyroid lobe  Although she likely has early Hashimoto's thyroiditis she is appearing asymptomatic Discussed with the patient that there is no indication for starting thyroid supplementation at this time and it may be prudent to observe her thyroid levels over time  She does have difficulty with maintaining her weight and recommended consistent diet and exercise for this  Treatment:  Observation only recommended.  Given patient information on hypothyroidism She will call if she has any onset of combination of symptoms of hypothyroidism as discussed today Will have her follow-up in  4 months with repeat TSH  Also discussed with her that if she has progressive rise in her TSH she probably will need to be on long-term thyroid supplementation    Meital Riehl 01/25/2015, 11:53 AM

## 2015-05-24 ENCOUNTER — Other Ambulatory Visit: Payer: PRIVATE HEALTH INSURANCE

## 2015-05-29 ENCOUNTER — Ambulatory Visit (INDEPENDENT_AMBULATORY_CARE_PROVIDER_SITE_OTHER): Payer: PRIVATE HEALTH INSURANCE | Admitting: Endocrinology

## 2015-05-29 ENCOUNTER — Encounter: Payer: Self-pay | Admitting: Endocrinology

## 2015-05-29 DIAGNOSIS — E039 Hypothyroidism, unspecified: Secondary | ICD-10-CM

## 2015-05-29 DIAGNOSIS — E038 Other specified hypothyroidism: Secondary | ICD-10-CM

## 2015-05-29 LAB — T4, FREE: Free T4: 0.75 ng/dL (ref 0.60–1.60)

## 2015-05-29 LAB — TSH: TSH: 1.8 u[IU]/mL (ref 0.35–4.50)

## 2015-05-29 NOTE — Progress Notes (Signed)
Patient ID: Kylie Cline, female   DOB: Aug 13, 1985, 30 y.o.   MRN: [id number]            Reason for Appointment:  Follow-up visit for thyroid    History of Present Illness:   The Hypothyroidism was first diagnosed in 12/2014  She was having screening labs done at her initial evaluation with her gynecologist and had a borderline high TSH level  She has had fluctuation of her weight over the last few years based on how much she is exercising and this year has gained 3 pounds since last visit  TSH was  Still only mildly increased and since she was asymptomatic she was told to come back in follow-up today  Patient  Still does not complain of any unusual fatigue, cold sensitivity, difficulty concentrating, dry skin or hair loss.   Wt Readings from Last 3 Encounters:  05/29/15 179 lb (81.194 kg)  01/25/15 174 lb 12.8 oz (79.289 kg)  01/05/15 176 lb (79.833 kg)     Lab Results  Component Value Date   TSH 6.664* 01/10/2015   TSH 4.841* 01/05/2015     No past medical history on file.  Past Surgical History  Procedure Laterality Date  . Knee surgery Right     Family History  Problem Relation Age of Onset  . Hypertension Father   . Diabetes Maternal Grandfather   . Diabetes Paternal Grandmother   . Thyroid disease Maternal Aunt     Social History:  reports that she has never smoked. She does not have any smokeless tobacco history on file. She reports that she drinks alcohol. She reports that she does not use illicit drugs.  Allergies:  Allergies  Allergen Reactions  . Shellfish Allergy       Medication List       This list is accurate as of: 05/29/15 10:56 AM.  Always use your most recent med list.               multivitamin-iron-minerals-folic acid chewable tablet  Chew 1 tablet by mouth daily.        Review of Systems:  Review of Systems  Constitutional: Negative for malaise/fatigue.  Cardiovascular: Negative for leg swelling.  Gastrointestinal:  Negative for constipation.  Musculoskeletal: Negative for joint pain.  Neurological: Negative for tingling.  Psychiatric/Behavioral: Negative for depression.    CARDIOLOGY: no history of high blood pressure.            ENDOCRINOLOGY:  no history of Diabetes.             Examination:    BP 114/78 mmHg  Pulse 74  Temp(Src) 98.4 F (36.9 C) (Oral)  Resp 12  Wt 179 lb (81.194 kg)  SpO2 98%   THYROID:  The right lobe of the thyroid is not clearly palpable, soft, smooth. The left lobe not palpable.     NEUROLOGICAL: Reflexes are bilaterally normal at biceps     Assessment   Subclinical hypothyroidism  She has had a mildly increased TSH without any symptoms . Has minimal soft swelling of her right thyroid lobe which is less prominent   Again she has early Hashimoto's thyroiditis she is appearing asymptomatic again      Plan :    recheck thyroid levels and decide on further management based on this       [facility] 05/29/2015, 10:56 AM

## 2015-05-29 NOTE — Progress Notes (Signed)
Quick Note:  Please let patient know that the lab result is back to normal and no further action needed, schedule follow-up for 1 year with labs ______

## 2016-02-22 IMAGING — DX DG CHEST 2V
2 series · 2 of 2 positions shown · non-contrast
Comparison: None.

CLINICAL DATA: Acute onset of cough, congestion and shortness of
breath. Initial encounter.

EXAM:
CHEST  2 VIEW

[chest pa]
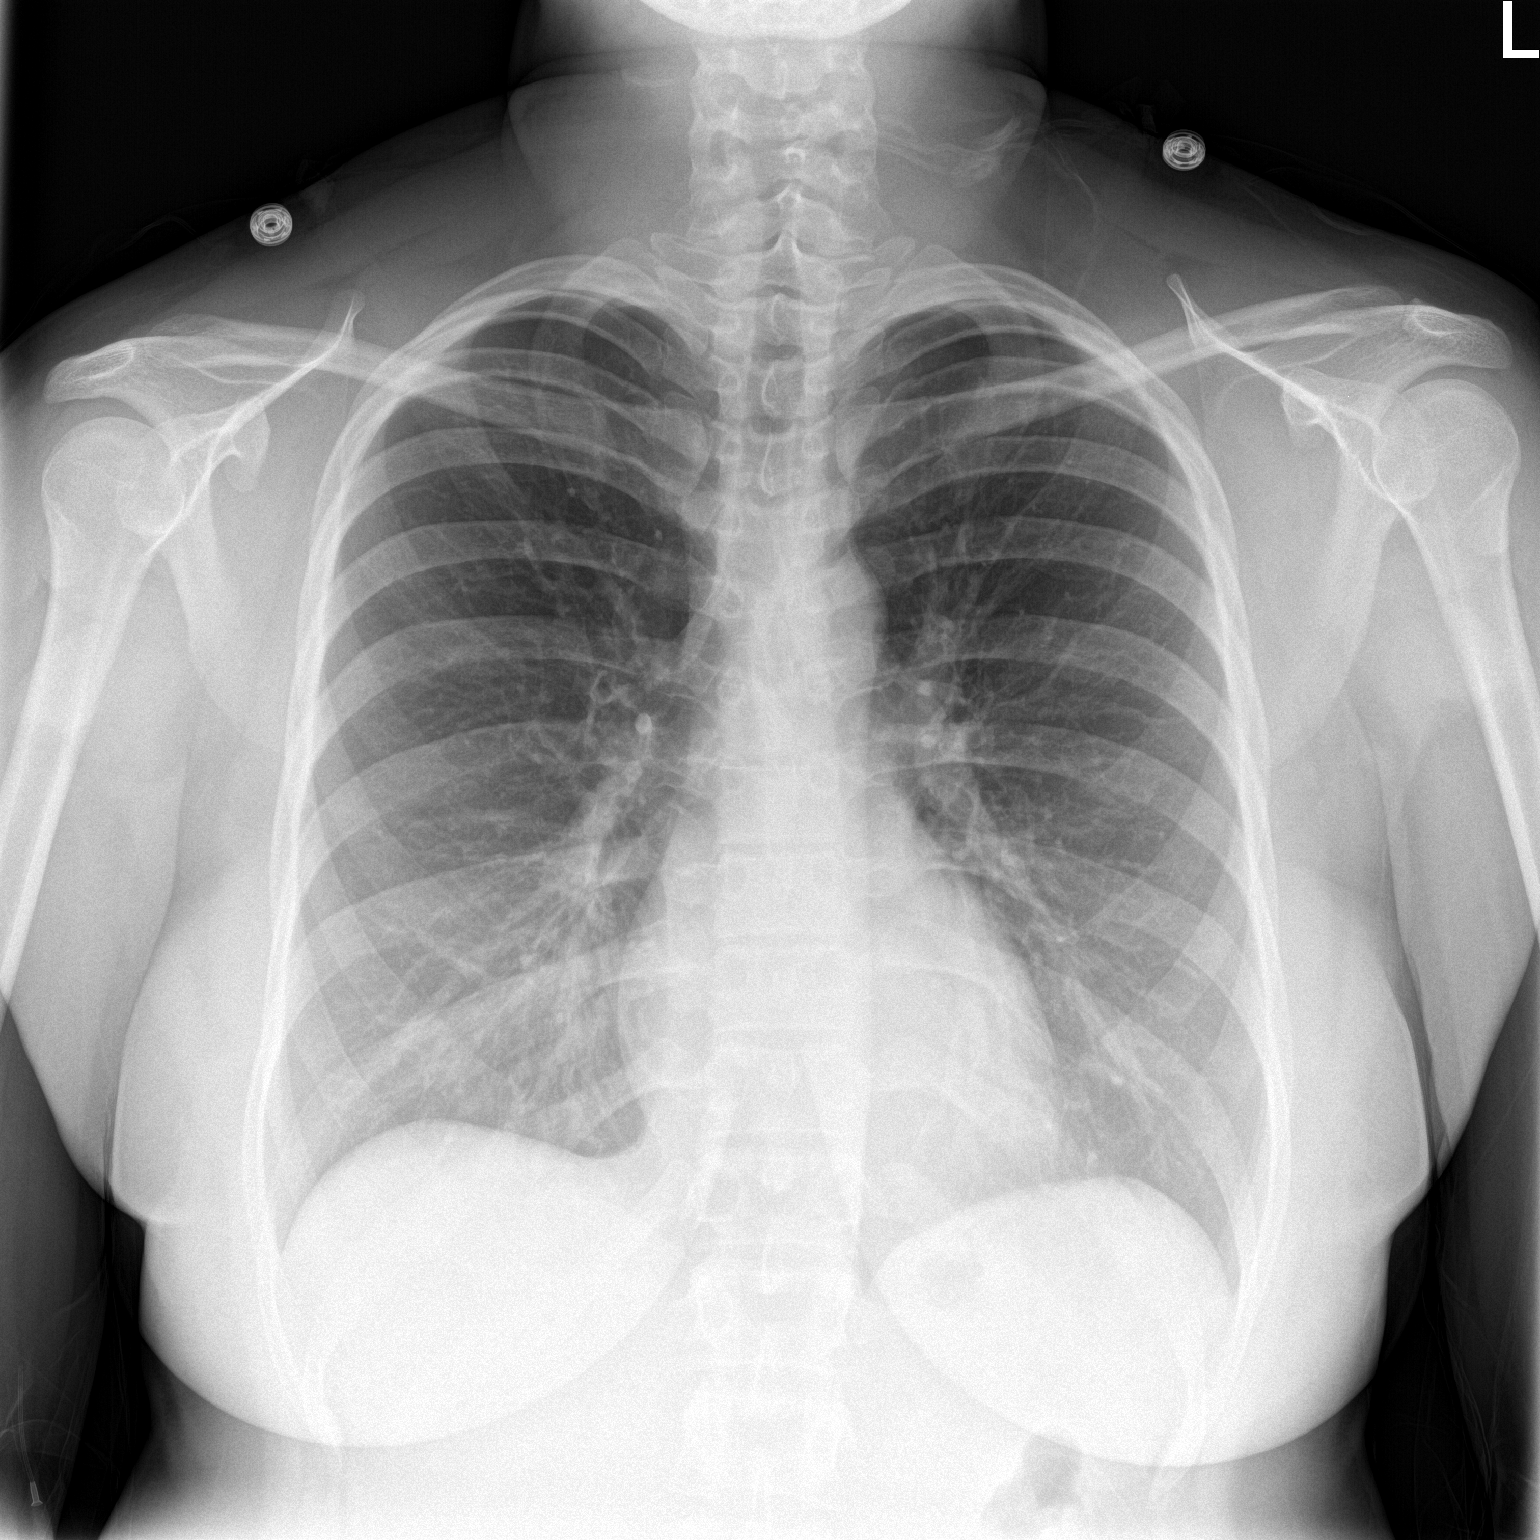

[chest lat]
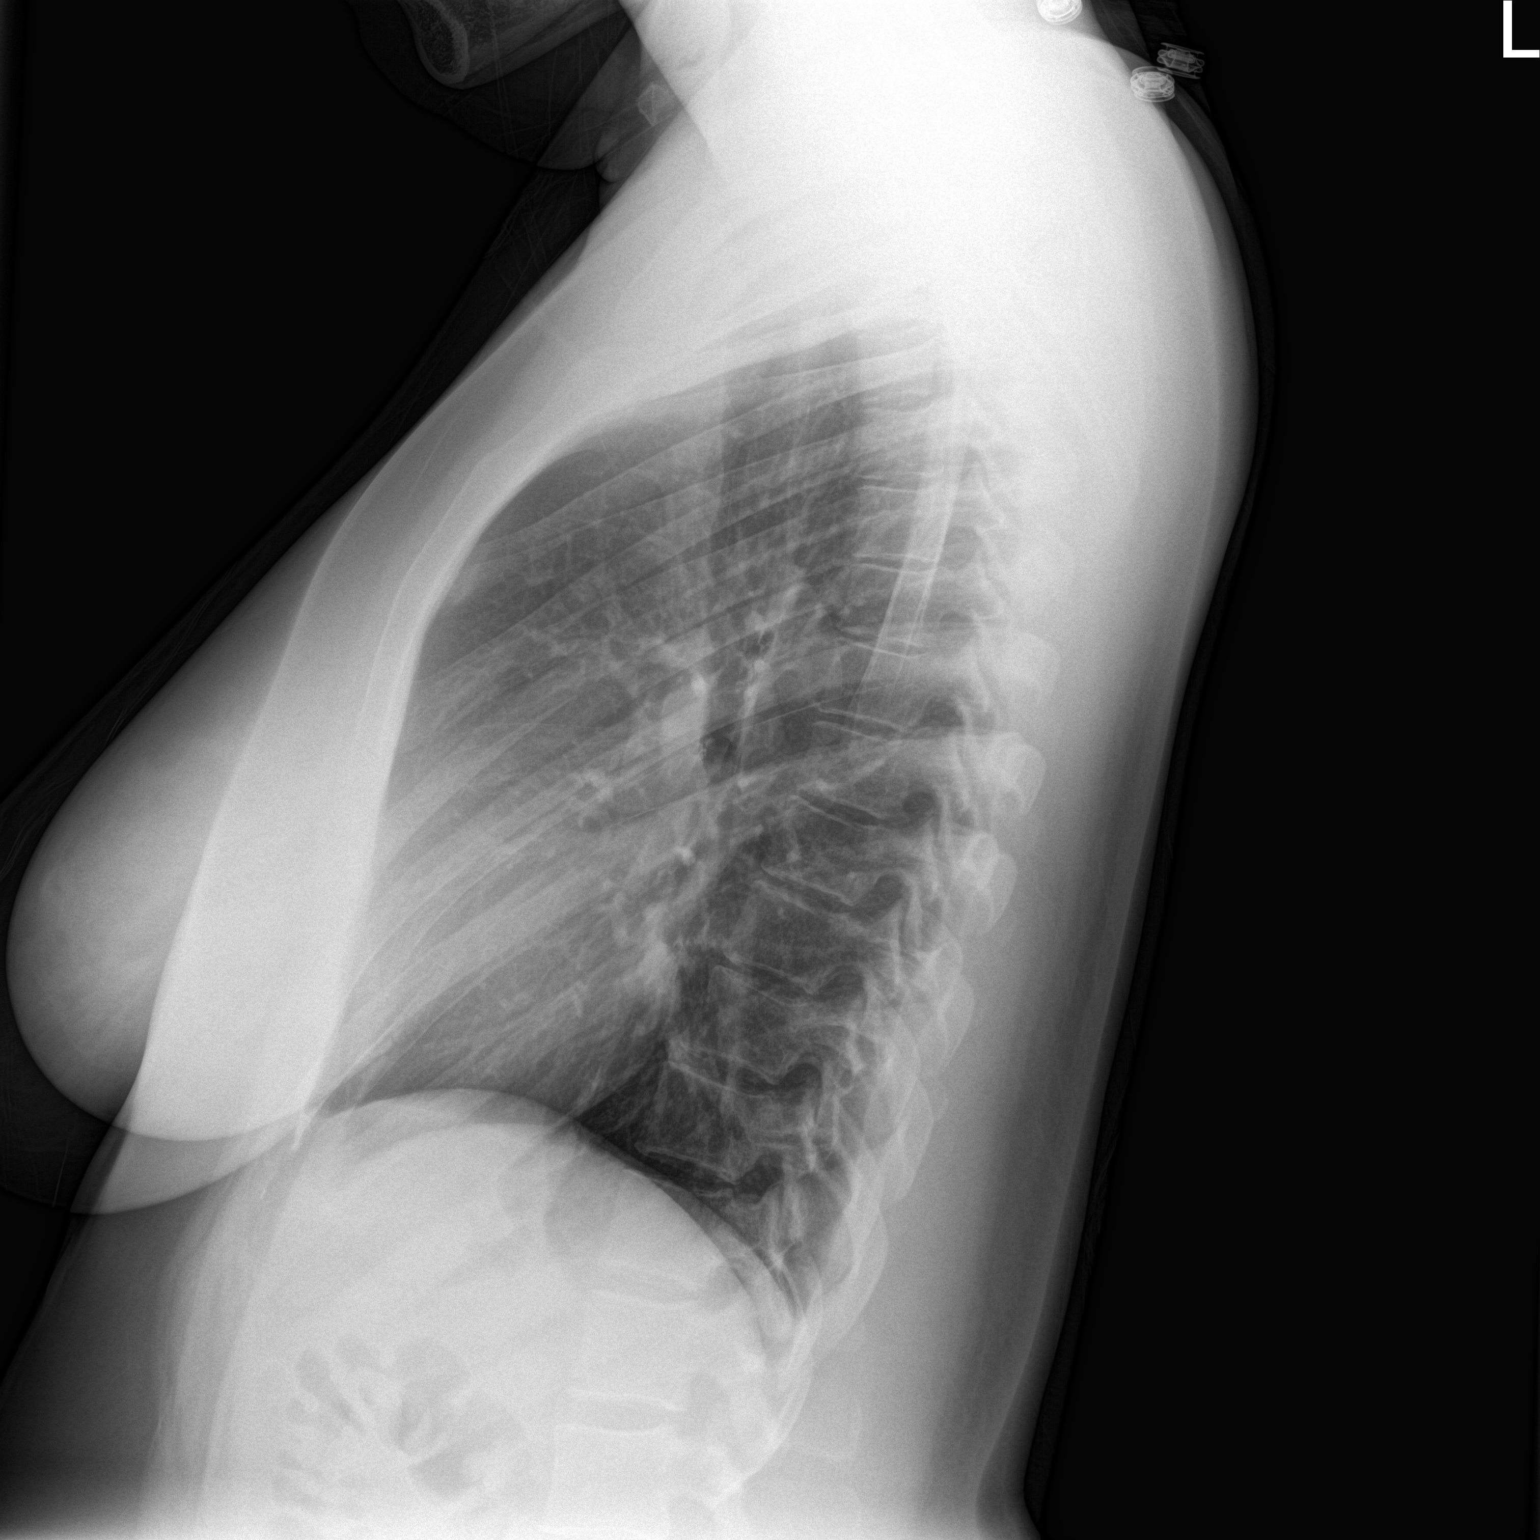

[2 of 2 positions shown; findings below may reference images not displayed]

FINDINGS: The lungs are well-aerated and clear. There is no evidence of focal
opacification, pleural effusion or pneumothorax.

The heart is normal in size; the mediastinal contour is within
normal limits. No acute osseous abnormalities are seen.
IMPRESSION: No acute cardiopulmonary process seen.

## 2016-02-27 ENCOUNTER — Encounter: Payer: No Typology Code available for payment source | Admitting: Gynecology

## 2016-03-08 ENCOUNTER — Encounter: Payer: Self-pay | Admitting: Gynecology

## 2016-03-08 ENCOUNTER — Ambulatory Visit (INDEPENDENT_AMBULATORY_CARE_PROVIDER_SITE_OTHER): Payer: Managed Care, Other (non HMO) | Admitting: Gynecology

## 2016-03-08 VITALS — BP 120/74 | Ht 61.75 in | Wt 178.0 lb

## 2016-03-08 DIAGNOSIS — Z01411 Encounter for gynecological examination (general) (routine) with abnormal findings: Secondary | ICD-10-CM

## 2016-03-08 DIAGNOSIS — Z30011 Encounter for initial prescription of contraceptive pills: Secondary | ICD-10-CM | POA: Diagnosis not present

## 2016-03-08 DIAGNOSIS — M6289 Other specified disorders of muscle: Secondary | ICD-10-CM

## 2016-03-08 LAB — CBC WITH DIFFERENTIAL/PLATELET
BASOS ABS: 0 {cells}/uL (ref 0–200)
Basophils Relative: 0 %
EOS PCT: 4 %
Eosinophils Absolute: 288 cells/uL (ref 15–500)
HCT: 41.2 % (ref 35.0–45.0)
HEMOGLOBIN: 13.3 g/dL (ref 11.7–15.5)
LYMPHS ABS: 2160 {cells}/uL (ref 850–3900)
Lymphocytes Relative: 30 %
MCH: 26.1 pg — ABNORMAL LOW (ref 27.0–33.0)
MCHC: 32.3 g/dL (ref 32.0–36.0)
MCV: 80.8 fL (ref 80.0–100.0)
MPV: 10.8 fL (ref 7.5–12.5)
Monocytes Absolute: 432 cells/uL (ref 200–950)
Monocytes Relative: 6 %
Neutro Abs: 4320 cells/uL (ref 1500–7800)
Neutrophils Relative %: 60 %
PLATELETS: 262 10*3/uL (ref 140–400)
RBC: 5.1 MIL/uL (ref 3.80–5.10)
RDW: 15.2 % — ABNORMAL HIGH (ref 11.0–15.0)
WBC: 7.2 10*3/uL (ref 3.8–10.8)

## 2016-03-08 LAB — COMPREHENSIVE METABOLIC PANEL
ALBUMIN: 4.4 g/dL (ref 3.6–5.1)
ALT: 9 U/L (ref 6–29)
AST: 13 U/L (ref 10–30)
Alkaline Phosphatase: 63 U/L (ref 33–115)
BILIRUBIN TOTAL: 0.4 mg/dL (ref 0.2–1.2)
BUN: 7 mg/dL (ref 7–25)
CO2: 25 mmol/L (ref 20–31)
CREATININE: 0.88 mg/dL (ref 0.50–1.10)
Calcium: 9.5 mg/dL (ref 8.6–10.2)
Chloride: 103 mmol/L (ref 98–110)
Glucose, Bld: 94 mg/dL (ref 65–99)
Potassium: 4.1 mmol/L (ref 3.5–5.3)
SODIUM: 138 mmol/L (ref 135–146)
TOTAL PROTEIN: 7.4 g/dL (ref 6.1–8.1)

## 2016-03-08 LAB — LIPID PANEL
Cholesterol: 195 mg/dL (ref <200)
HDL: 62 mg/dL (ref >50)
LDL Cholesterol: 114 mg/dL — ABNORMAL HIGH (ref <100)
TRIGLYCERIDES: 96 mg/dL (ref <150)
Total CHOL/HDL Ratio: 3.1 Ratio (ref <5.0)
VLDL: 19 mg/dL (ref <30)

## 2016-03-08 MED ORDER — NORETHIN-ETH ESTRAD-FE BIPHAS 1 MG-10 MCG / 10 MCG PO TABS: 1.0000 | ORAL_TABLET | Freq: Every day | ORAL | 11 refills | Status: DC

## 2016-03-08 NOTE — Patient Instructions (Signed)
Oral Contraception Information Oral contraceptive pills (OCPs) are medicines taken to prevent pregnancy. OCPs work by preventing the ovaries from releasing eggs. The hormones in OCPs also cause the cervical mucus to thicken, preventing the sperm from entering the uterus. The hormones also cause the uterine lining to become thin, not allowing a fertilized egg to attach to the inside of the uterus. OCPs are highly effective when taken exactly as prescribed. However, OCPs do not prevent sexually transmitted diseases (STDs). Safe sex practices, such as using condoms along with the pill, can help prevent STDs.  Before taking the pill, you may have a physical exam and Pap test. Your health care provider may order blood tests. The health care provider will make sure you are a good candidate for oral contraception. Discuss with your health care provider the possible side effects of the OCP you may be prescribed. When starting an OCP, it can take 2 to 3 months for the body to adjust to the changes in hormone levels in your body.  TYPES OF ORAL CONTRACEPTION  The combination pill-This pill contains estrogen and progestin (synthetic progesterone) hormones. The combination pill comes in 21-day, 28-day, or 91-day packs. Some types of combination pills are meant to be taken continuously (365-day pills). With 21-day packs, you do not take pills for 7 days after the last pill. With 28-day packs, the pill is taken every day. The last 7 pills are without hormones. Certain types of pills have more than 21 hormone-containing pills. With 91-day packs, the first 84 pills contain both hormones, and the last 7 pills contain no hormones or contain estrogen only.  The minipill-This pill contains the progesterone hormone only. The pill is taken every day continuously. It is very important to take the pill at the same time each day. The minipill comes in packs of 28 pills. All 28 pills contain the hormone.  ADVANTAGES OF ORAL  CONTRACEPTIVE PILLS  Decreases premenstrual symptoms.   Treats menstrual period cramps.   Regulates the menstrual cycle.   Decreases a heavy menstrual flow.   May treatacne, depending on the type of pill.   Treats abnormal uterine bleeding.   Treats polycystic ovarian syndrome.   Treats endometriosis.   Can be used as emergency contraception.  THINGS THAT CAN MAKE ORAL CONTRACEPTIVE PILLS LESS EFFECTIVE OCPs can be less effective if:   You forget to take the pill at the same time every day.   You have a stomach or intestinal disease that lessens the absorption of the pill.   You take OCPs with other medicines that make OCPs less effective, such as antibiotics, certain HIV medicines, and some seizure medicines.   You take expired OCPs.   You forget to restart the pill on day 7, when using the packs of 21 pills.  RISKS ASSOCIATED WITH ORAL CONTRACEPTIVE PILLS  Oral contraceptive pills can sometimes cause side effects, such as:  Headache.  Nausea.  Breast tenderness.  Irregular bleeding or spotting. Combination pills are also associated with a small increased risk of:  Blood clots.  Heart attack.  Stroke. This information is not intended to replace advice given to you by your health care provider. Make sure you discuss any questions you have with your health care provider. Document Released: 06/15/2002 Document Revised: 07/17/2015 Document Reviewed: 09/13/2012 Elsevier Interactive Patient Education  2017 [employer]  

## 2016-03-08 NOTE — Progress Notes (Signed)
Kylie Cline 02/24/1986 [id number]   History:    30 y.o.  for annual gyn exam with the only complaint today is feeling tired and weak and the time of her menses. She reports normal menstrual cycles are heavy the first 2 days but a total of 4 days. She is not sexually active and reports that her cycles are otherwise normal. Patient also last year had been referred to the endocrinologist as a result of her subclinical hypothyroidism and Dr. Lucianne MussKumar has been monitoring and recently had her thyroid function test. She's currently on no medication. Patient with no past history of any abnormal Pap smears. Patient declined flu vaccine today.   Past medical history,surgical history, family history and social history were all reviewed and documented in the EPIC chart.  Gynecologic History Patient's last menstrual period was 02/26/2016. Contraception: none Last Pap: 2016. Results were: normal Last mammogram: Not indicated. Results were: Not indicated  Obstetric History OB History  Gravida Para Term Preterm AB Living  0 0 0 0 0 0  SAB TAB Ectopic Multiple Live Births  0 0 0 0           ROS: A ROS was performed and pertinent positives and negatives are included in the history.  GENERAL: No fevers or chills. HEENT: No change in vision, no earache, sore throat or sinus congestion. NECK: No pain or stiffness. CARDIOVASCULAR: No chest pain or pressure. No palpitations. PULMONARY: No shortness of breath, cough or wheeze. GASTROINTESTINAL: No abdominal pain, nausea, vomiting or diarrhea, melena or bright red blood per rectum. GENITOURINARY: No urinary frequency, urgency, hesitancy or dysuria. MUSCULOSKELETAL: No joint or muscle pain, no back pain, no recent trauma. DERMATOLOGIC: No rash, no itching, no lesions. ENDOCRINE: No polyuria, polydipsia, no heat or cold intolerance. No recent change in weight. HEMATOLOGICAL: No anemia or easy bruising or bleeding. NEUROLOGIC: No headache, seizures, numbness,  tingling or weakness. PSYCHIATRIC: No depression, no loss of interest in normal activity or change in sleep pattern.     Exam: chaperone present  BP 120/74   Ht 5' 1.75" (1.568 m)   Wt 178 lb (80.7 kg)   LMP 02/26/2016   BMI 32.82 kg/m   Body mass index is 32.82 kg/m.  General appearance : Well developed well nourished female. No acute distress HEENT: Eyes: no retinal hemorrhage or exudates,  Neck supple, trachea midline, no carotid bruits, no thyroidmegaly Lungs: Clear to auscultation, no rhonchi or wheezes, or rib retractions  Heart: Regular rate and rhythm, no murmurs or gallops Breast:Examined in sitting and supine position were symmetrical in appearance, no palpable masses or tenderness,  no skin retraction, no nipple inversion, no nipple discharge, no skin discoloration, no axillary or supraclavicular lymphadenopathy Abdomen: no palpable masses or tenderness, no rebound or guarding Extremities: no edema or skin discoloration or tenderness  Pelvic:  Bartholin, Urethra, Skene Glands: Within normal limits             Vagina: No gross lesions or discharge  Cervix: No gross lesions or discharge  Uterus  anteverted, normal size, shape and consistency, non-tender and mobile  Adnexa  Without masses or tenderness  Anus and perineum  normal   Rectovaginal  normal sphincter tone without palpated masses or tenderness             Hemoccult not indicated     Assessment/Plan:  30 y.o. female for annual exam with tiredness and fatigue especially during her menses. We are going to check a vitamin  D level today along with the following screening fasting blood work: Comprehensive metabolic panel, CBC, lipid profile, and urinalysis. Pap smear not indicated this year. Patient declined flu vaccine. An effort to help with her menstrual cycles and dysmenorrhea and her tiredness and fatigue the event of a mild anemia I'm going to start her on a low dose oral contraceptive pill such as Loestrin FE to  take 1 by mouth daily. Risks benefits and pros and cons of oral contraceptive pills were discussed include DVT and pulmonary embolism. Instructions were provided.  Ok EdwardsFERNANDEZ,Kylie Adriance H MD, 10:18 AM 03/08/2016

## 2016-03-09 LAB — URINALYSIS W MICROSCOPIC + REFLEX CULTURE
Bacteria, UA: NONE SEEN [HPF]
Bilirubin Urine: NEGATIVE
CASTS: NONE SEEN [LPF]
Crystals: NONE SEEN [HPF]
GLUCOSE, UA: NEGATIVE
HGB URINE DIPSTICK: NEGATIVE
Ketones, ur: NEGATIVE
LEUKOCYTES UA: NEGATIVE
NITRITE: NEGATIVE
PH: 5.5 (ref 5.0–8.0)
Protein, ur: NEGATIVE
RBC / HPF: NONE SEEN RBC/HPF (ref <=2)
SPECIFIC GRAVITY, URINE: 1.019 (ref 1.001–1.035)
SQUAMOUS EPITHELIAL / LPF: NONE SEEN [HPF] (ref <=5)
WBC, UA: NONE SEEN WBC/HPF (ref <=5)
Yeast: NONE SEEN [HPF]

## 2016-03-09 LAB — VITAMIN D 25 HYDROXY (VIT D DEFICIENCY, FRACTURES): Vit D, 25-Hydroxy: 14 ng/mL — ABNORMAL LOW (ref 30–100)

## 2016-03-10 ENCOUNTER — Encounter: Payer: Self-pay | Admitting: Gynecology

## 2016-03-12 ENCOUNTER — Other Ambulatory Visit: Payer: Self-pay | Admitting: Gynecology

## 2016-03-12 DIAGNOSIS — E559 Vitamin D deficiency, unspecified: Secondary | ICD-10-CM

## 2016-03-12 MED ORDER — VITAMIN D (ERGOCALCIFEROL) 1.25 MG (50000 UNIT) PO CAPS: 50000.0000 [IU] | ORAL_CAPSULE | ORAL | 0 refills | Status: DC

## 2016-08-21 ENCOUNTER — Encounter: Payer: Self-pay | Admitting: Gynecology

## 2016-12-16 ENCOUNTER — Ambulatory Visit (INDEPENDENT_AMBULATORY_CARE_PROVIDER_SITE_OTHER): Payer: 59 | Admitting: Gynecology

## 2016-12-16 ENCOUNTER — Encounter: Payer: Self-pay | Admitting: Gynecology

## 2016-12-16 VITALS — BP 126/80

## 2016-12-16 DIAGNOSIS — H9201 Otalgia, right ear: Secondary | ICD-10-CM | POA: Diagnosis not present

## 2016-12-16 DIAGNOSIS — R5383 Other fatigue: Secondary | ICD-10-CM

## 2016-12-16 NOTE — Progress Notes (Signed)
Kylie Cline 14-Jul-1985 [id number]        31 y.o.  G0P0000 presents complaining of fatigue. Just feels tired with lack of energy. Notes onset 2 weeks ago following returning from her vacation. Had been seen for fatigue by Dr. Lily Peer 03/2016 but notes that this had disappeared and she felt well up until this last several weeks. No weight changes such as weight gain or loss, skin changes such as rashes, dry skin or oily skin. No hair loss. No nausea vomiting, diarrhea, constipation. No night sweats. Currently taking no medications, off of birth control pills that she transiently took previously as she is not sexually active. Also notes transient right ear pain that she had several weeks ago but this seems to be better. No cough or sputum, headaches, blurred vision.  Past medical history,surgical history, problem list, medications, allergies, family history and social history were all reviewed and documented in the EPIC chart.  Directed ROS with pertinent positives and negatives documented in the history of present illness/assessment and plan.  Exam: Vitals:   12/16/16 1154  BP: 126/80   General appearance:  Normal Skin without rashes or abnormalities HEENT normal without evidence of thyromegaly or other pathology. Tympanic membranes normal with normal light reflex bilaterally. No evidence of otitis externa Respiratory clear Cardiac without rubs murmurs or gallops Abdomen soft nontender without masses guarding rebound active bowel sounds throughout.   Assessment/Plan:  31 y.o. G0P0000 with complaints of fatigue with no localizing symptoms or findings on physical exam. Short duration for 2 weeks. Will check baseline CBC, CMP, TSH and vitamin D as she has been noted to be vitamin D deficient in the past. Exercise on a regular basis recommended and stressed. Starting on a multivitamin with iron recommended such as a over-the-counter prenatal vitamin. Follow up with a primary physician if  her symptoms continue. Follow up with ENT if ear pain returns.    Dara Lords MD, 12:40 PM 12/16/2016

## 2016-12-16 NOTE — Patient Instructions (Signed)
Start on a regular exercise program 3-4 times weekly  Take a multivitamin such as an over-the-counter prenatal vitamin  Office will contact you with lab results.

## 2016-12-17 ENCOUNTER — Other Ambulatory Visit: Payer: Self-pay | Admitting: Gynecology

## 2016-12-17 DIAGNOSIS — E559 Vitamin D deficiency, unspecified: Secondary | ICD-10-CM

## 2016-12-17 LAB — COMPREHENSIVE METABOLIC PANEL
AG RATIO: 1.5 (calc) (ref 1.0–2.5)
ALBUMIN MSPROF: 4.3 g/dL (ref 3.6–5.1)
ALT: 8 U/L (ref 6–29)
AST: 11 U/L (ref 10–30)
Alkaline phosphatase (APISO): 60 U/L (ref 33–115)
BILIRUBIN TOTAL: 0.3 mg/dL (ref 0.2–1.2)
BUN: 10 mg/dL (ref 7–25)
CALCIUM: 9.4 mg/dL (ref 8.6–10.2)
CHLORIDE: 105 mmol/L (ref 98–110)
CO2: 25 mmol/L (ref 20–32)
Creat: 0.86 mg/dL (ref 0.50–1.10)
GLOBULIN: 2.9 g/dL (ref 1.9–3.7)
Glucose, Bld: 77 mg/dL (ref 65–99)
POTASSIUM: 4 mmol/L (ref 3.5–5.3)
SODIUM: 138 mmol/L (ref 135–146)
Total Protein: 7.2 g/dL (ref 6.1–8.1)

## 2016-12-17 LAB — CBC WITH DIFFERENTIAL/PLATELET
BASOS ABS: 18 {cells}/uL (ref 0–200)
Basophils Relative: 0.3 %
EOS PCT: 3.2 %
Eosinophils Absolute: 192 cells/uL (ref 15–500)
HCT: 38 % (ref 35.0–45.0)
Hemoglobin: 12.2 g/dL (ref 11.7–15.5)
LYMPHS ABS: 2274 {cells}/uL (ref 850–3900)
MCH: 25.6 pg — ABNORMAL LOW (ref 27.0–33.0)
MCHC: 32.1 g/dL (ref 32.0–36.0)
MCV: 79.8 fL — AB (ref 80.0–100.0)
MPV: 11.8 fL (ref 7.5–12.5)
Monocytes Relative: 8.5 %
NEUTROS PCT: 50.1 %
Neutro Abs: 3006 cells/uL (ref 1500–7800)
PLATELETS: 234 10*3/uL (ref 140–400)
RBC: 4.76 10*6/uL (ref 3.80–5.10)
RDW: 13.5 % (ref 11.0–15.0)
TOTAL LYMPHOCYTE: 37.9 %
WBC mixed population: 510 cells/uL (ref 200–950)
WBC: 6 10*3/uL (ref 3.8–10.8)

## 2016-12-17 LAB — VITAMIN D 25 HYDROXY (VIT D DEFICIENCY, FRACTURES): Vit D, 25-Hydroxy: 19 ng/mL — ABNORMAL LOW (ref 30–100)

## 2016-12-17 LAB — TSH: TSH: 3.4 mIU/L

## 2016-12-17 MED ORDER — VITAMIN D (ERGOCALCIFEROL) 1.25 MG (50000 UNIT) PO CAPS: 50000.0000 [IU] | ORAL_CAPSULE | ORAL | 0 refills | Status: AC

## 2017-03-10 ENCOUNTER — Other Ambulatory Visit: Payer: 59

## 2017-03-10 DIAGNOSIS — E559 Vitamin D deficiency, unspecified: Secondary | ICD-10-CM

## 2017-03-11 LAB — VITAMIN D 25 HYDROXY (VIT D DEFICIENCY, FRACTURES): VIT D 25 HYDROXY: 36 ng/mL (ref 30–100)

## 2017-03-18 ENCOUNTER — Other Ambulatory Visit: Payer: Self-pay | Admitting: Gynecology

## 2017-03-30 ENCOUNTER — Other Ambulatory Visit: Payer: Self-pay | Admitting: Gynecology

## 2017-04-07 ENCOUNTER — Other Ambulatory Visit: Payer: Self-pay

## 2018-04-02 ENCOUNTER — Encounter: Payer: 59 | Admitting: Gynecology

## 2018-11-13 ENCOUNTER — Other Ambulatory Visit: Payer: Self-pay | Admitting: Gynecology

## 2018-12-28 ENCOUNTER — Encounter: Payer: Self-pay | Admitting: Gynecology

## 2021-09-06 ENCOUNTER — Encounter: Payer: Self-pay | Admitting: Podiatry

## 2021-09-06 ENCOUNTER — Ambulatory Visit: Payer: 59 | Admitting: Podiatry

## 2021-09-06 DIAGNOSIS — B351 Tinea unguium: Secondary | ICD-10-CM

## 2021-09-06 MED ORDER — CICLOPIROX 8 % EX SOLN: Freq: Every day | CUTANEOUS | 0 refills | Status: AC

## 2021-09-06 NOTE — Progress Notes (Signed)
  Subjective:  Patient ID: Kylie Cline, female    DOB: [birth year],   MRN: [id number]  Chief Complaint  Patient presents with   Nail Problem    Left great toenails    36 y.o. female presents for concern of left great toenail discolored and splitting. Relates it has been ongoing for about 2 weeks. Concerned for a fungus. Relates she has tried to clip around the nail. Denies pain. She is not diabetic. Denies any other pedal complaints. Denies n/v/f/c.   Past Medical History:  Diagnosis Date   Vitamin D deficiency     Objective:  Physical Exam: Vascular: DP/PT pulses 2/4 bilateral. CFT <3 seconds. Normal hair growth on digits. No edema.  Skin. No lacerations or abrasions bilateral feet. Right hallux nail with distal split and linea discoloration noted proximally 2/3 of the nail  Musculoskeletal: MMT 5/5 bilateral lower extremities in DF, PF, Inversion and Eversion. Deceased ROM in DF of ankle joint.  Neurological: Sensation intact to light touch.   Assessment:   1. Onychomycosis      Plan:  Patient was evaluated and treated and all questions answered. -Examined patient -Discussed treatment options for painful dystrophic nails  -Discussed fungal nail treatment options including oral, topical, and laser treatments.  -Will start out trying the penlac and see if this improves the area.  -Patient to return in 3 months for recheck.    Lorenda Peck, DPM

## 2021-12-13 ENCOUNTER — Ambulatory Visit: Payer: 59 | Admitting: Podiatry

## 2023-02-11 ENCOUNTER — Ambulatory Visit
Admission: RE | Admit: 2023-02-11 | Discharge: 2023-02-11 | Disposition: A | Discharge status: Home / Self Care | Payer: 59 | Source: Ambulatory Visit | Attending: Internal Medicine | Admitting: Internal Medicine

## 2023-02-11 VITALS — BP 143/95 | HR 93 | Temp 99.0°F | Resp 18

## 2023-02-11 DIAGNOSIS — Z20828 Contact with and (suspected) exposure to other viral communicable diseases: Secondary | ICD-10-CM | POA: Insufficient documentation

## 2023-02-11 DIAGNOSIS — Z202 Contact with and (suspected) exposure to infections with a predominantly sexual mode of transmission: Secondary | ICD-10-CM | POA: Diagnosis present

## 2023-02-11 DIAGNOSIS — Z113 Encounter for screening for infections with a predominantly sexual mode of transmission: Secondary | ICD-10-CM | POA: Diagnosis not present

## 2023-02-11 MED ORDER — VALACYCLOVIR HCL 1 G PO TABS: 1000.0000 mg | ORAL_TABLET | Freq: Three times a day (TID) | ORAL | 0 refills | Status: AC

## 2023-02-11 MED ORDER — METRONIDAZOLE 500 MG PO TABS: 500.0000 mg | ORAL_TABLET | Freq: Two times a day (BID) | ORAL | 0 refills | Status: AC

## 2023-02-11 NOTE — ED Triage Notes (Signed)
Pt presents fro STD's test. Pt reports partner tested positive for Trichomoniasis and HSV-1.

## 2023-02-11 NOTE — Discharge Instructions (Addendum)
Take metronidazole for the trichomonas exposure. Take valacyclovir for the herpes exposure.

## 2023-02-11 NOTE — ED Provider Notes (Signed)
[facility]  Note:  This document was prepared using Conservation officer, historic buildings and may include unintentional dictation errors.  MRN: [id number] DOB: 1986/02/07  Subjective:   Kylie Cline is a 37 y.o. female presenting for STI testing.  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  Denies fever, n/v, abdominal pain, pelvic pain, rashes, dysuria, urinary frequency, hematuria, vaginal discharge.    No current facility-administered medications for this encounter.  Current Outpatient Medications:    ciclopirox (PENLAC) 8 % solution, Apply topically at bedtime. Apply over nail and surrounding skin. Apply daily over previous coat. After seven (7) days, may remove with alcohol and continue cycle., Disp: 6.6 mL, Rfl: 0   Vitamin D, Ergocalciferol, (DRISDOL) 50000 units CAPS capsule, Take 1 capsule (50,000 Units total) by mouth every 7 (seven) days., Disp: 12 capsule, Rfl: 0   Allergies  Allergen Reactions   Shellfish Allergy     Past Medical History:  Diagnosis Date   Vitamin D deficiency      Past Surgical History:  Procedure Laterality Date   KNEE SURGERY Right     Family History  Problem Relation Age of Onset   Hypertension Father    Diabetes Maternal Grandfather    Diabetes Paternal Grandmother    Thyroid disease Maternal Aunt     Social History   Tobacco Use   Smoking status: Never   Smokeless tobacco: Never  Vaping Use   Vaping status: Never Used  Substance Use Topics   Alcohol use: Yes    Alcohol/week: 0.0 standard drinks of alcohol    Comment: SOCIAL   Drug use: No    ROS   Objective:   Vitals: BP (!) 143/95 (BP Location: Left Arm)   Pulse 93   Temp 99 F (37.2 C) (Oral)   Resp 18   LMP 01/27/2023 (Approximate)   SpO2 97%   Physical Exam Constitutional:      General: She is not in acute distress.    Appearance: Normal appearance. She is well-developed. She is not ill-appearing,  toxic-appearing or diaphoretic.  HENT:     Head: Normocephalic and atraumatic.     Nose: Nose normal.     Mouth/Throat:     Mouth: Mucous membranes are moist.  Eyes:     General: No scleral icterus.       Right eye: No discharge.        Left eye: No discharge.     Extraocular Movements: Extraocular movements intact.  Cardiovascular:     Rate and Rhythm: Normal rate.  Pulmonary:     Effort: Pulmonary effort is normal.  Skin:    General: Skin is warm and dry.  Neurological:     General: No focal deficit present.     Mental Status: She is alert and oriented to person, place, and time.  Psychiatric:        Mood and Affect: Mood normal.        Behavior: Behavior normal.     Assessment and Plan :   PDMP not reviewed this encounter.  1. Exposure to trichomonas   2. Exposure to herpes simplex virus (HSV)   3. Screen for STD (sexually transmitted disease)    Will treat empirically, labs pending.  Start valacyclovir for exposure to HSV, metronidazole for exposure to trichomonas.  Counseled patient on potential for adverse effects with medications prescribed/recommended today, ER and return-to-clinic precautions discussed, patient verbalized understanding.    Wallis Bamberg, PA-C 02/11/23  [id number]  

## 2023-02-12 LAB — CERVICOVAGINAL ANCILLARY ONLY
Chlamydia: NEGATIVE
Comment: NEGATIVE
Comment: NEGATIVE
Comment: NORMAL
Neisseria Gonorrhea: NEGATIVE
Trichomonas: NEGATIVE

## 2023-02-13 LAB — RPR: RPR Ser Ql: NONREACTIVE

## 2023-02-13 LAB — HIV ANTIBODY (ROUTINE TESTING W REFLEX): HIV Screen 4th Generation wRfx: NONREACTIVE

## 2023-03-19 ENCOUNTER — Ambulatory Visit: Payer: Self-pay
